# Patient Record
Sex: Female | Born: 1970 | Race: Black or African American | Hispanic: No | Marital: Married | Smoking: Never smoker
Health system: Southern US, Community
[De-identification: ages and names within clinical notes are randomized; demographics above are authoritative.]

## PROBLEM LIST (undated history)

## (undated) DIAGNOSIS — I10 Essential (primary) hypertension: Secondary | ICD-10-CM

## (undated) HISTORY — PX: TUMOR REMOVAL: SHX12

## (undated) HISTORY — PX: WISDOM TOOTH EXTRACTION: SHX21

## (undated) HISTORY — PX: CARPAL TUNNEL RELEASE: SHX101

---

## 1997-06-23 ENCOUNTER — Ambulatory Visit (HOSPITAL_COMMUNITY): Admission: RE | Admit: 1997-06-23 | Discharge: 1997-06-23 | Payer: Self-pay | Admitting: Obstetrics

## 1997-09-16 ENCOUNTER — Inpatient Hospital Stay (HOSPITAL_COMMUNITY): Admission: AD | Admit: 1997-09-16 | Discharge: 1997-09-16 | Payer: Self-pay | Admitting: Obstetrics

## 1997-11-05 ENCOUNTER — Ambulatory Visit (HOSPITAL_COMMUNITY): Admission: RE | Admit: 1997-11-05 | Discharge: 1997-11-05 | Payer: Self-pay | Admitting: Obstetrics

## 1998-01-31 ENCOUNTER — Inpatient Hospital Stay (HOSPITAL_COMMUNITY): Admission: AD | Admit: 1998-01-31 | Discharge: 1998-02-02 | Payer: Self-pay | Admitting: Obstetrics

## 1998-11-05 ENCOUNTER — Other Ambulatory Visit: Admission: RE | Admit: 1998-11-05 | Discharge: 1998-11-05 | Payer: Self-pay | Admitting: Obstetrics

## 1999-04-05 ENCOUNTER — Other Ambulatory Visit: Admission: RE | Admit: 1999-04-05 | Discharge: 1999-04-05 | Payer: Self-pay | Admitting: Obstetrics

## 1999-05-19 ENCOUNTER — Other Ambulatory Visit: Admission: RE | Admit: 1999-05-19 | Discharge: 1999-05-19 | Payer: Self-pay | Admitting: Obstetrics

## 2000-01-27 ENCOUNTER — Emergency Department (HOSPITAL_COMMUNITY): Admission: EM | Admit: 2000-01-27 | Discharge: 2000-01-28 | Payer: Self-pay | Admitting: Emergency Medicine

## 2000-01-27 ENCOUNTER — Encounter: Payer: Self-pay | Admitting: Emergency Medicine

## 2000-03-20 ENCOUNTER — Other Ambulatory Visit: Admission: RE | Admit: 2000-03-20 | Discharge: 2000-03-20 | Payer: Self-pay | Admitting: Family Medicine

## 2000-08-21 ENCOUNTER — Emergency Department (HOSPITAL_COMMUNITY): Admission: EM | Admit: 2000-08-21 | Discharge: 2000-08-21 | Payer: Self-pay | Admitting: Emergency Medicine

## 2000-09-16 ENCOUNTER — Emergency Department (HOSPITAL_COMMUNITY): Admission: EM | Admit: 2000-09-16 | Discharge: 2000-09-17 | Payer: Self-pay | Admitting: Emergency Medicine

## 2000-09-17 ENCOUNTER — Encounter: Payer: Self-pay | Admitting: Emergency Medicine

## 2000-12-20 ENCOUNTER — Encounter: Admission: RE | Admit: 2000-12-20 | Discharge: 2000-12-20 | Payer: Self-pay | Admitting: Family Medicine

## 2000-12-20 ENCOUNTER — Encounter: Payer: Self-pay | Admitting: Family Medicine

## 2001-02-24 ENCOUNTER — Inpatient Hospital Stay (HOSPITAL_COMMUNITY): Admission: AD | Admit: 2001-02-24 | Discharge: 2001-02-24 | Payer: Self-pay | Admitting: Obstetrics

## 2001-07-23 ENCOUNTER — Inpatient Hospital Stay (HOSPITAL_COMMUNITY): Admission: AD | Admit: 2001-07-23 | Discharge: 2001-07-23 | Payer: Self-pay | Admitting: Obstetrics

## 2002-10-08 ENCOUNTER — Emergency Department (HOSPITAL_COMMUNITY): Admission: EM | Admit: 2002-10-08 | Discharge: 2002-10-08 | Payer: Self-pay | Admitting: Emergency Medicine

## 2002-10-08 ENCOUNTER — Encounter: Payer: Self-pay | Admitting: Emergency Medicine

## 2003-02-09 ENCOUNTER — Inpatient Hospital Stay (HOSPITAL_COMMUNITY): Admission: AD | Admit: 2003-02-09 | Discharge: 2003-02-10 | Payer: Self-pay | Admitting: Obstetrics

## 2003-08-01 ENCOUNTER — Emergency Department (HOSPITAL_COMMUNITY): Admission: AD | Admit: 2003-08-01 | Discharge: 2003-08-01 | Payer: Self-pay | Admitting: Family Medicine

## 2003-10-29 ENCOUNTER — Emergency Department (HOSPITAL_COMMUNITY): Admission: EM | Admit: 2003-10-29 | Discharge: 2003-10-29 | Payer: Self-pay | Admitting: Family Medicine

## 2004-08-23 ENCOUNTER — Ambulatory Visit (HOSPITAL_COMMUNITY): Admission: RE | Admit: 2004-08-23 | Discharge: 2004-08-23 | Payer: Self-pay | Admitting: Specialist

## 2004-08-23 ENCOUNTER — Ambulatory Visit (HOSPITAL_BASED_OUTPATIENT_CLINIC_OR_DEPARTMENT_OTHER): Admission: RE | Admit: 2004-08-23 | Discharge: 2004-08-23 | Payer: Self-pay | Admitting: Specialist

## 2004-10-25 ENCOUNTER — Encounter: Admission: RE | Admit: 2004-10-25 | Discharge: 2004-10-25 | Payer: Self-pay | Admitting: Family Medicine

## 2005-07-01 ENCOUNTER — Ambulatory Visit (HOSPITAL_COMMUNITY): Admission: RE | Admit: 2005-07-01 | Discharge: 2005-07-01 | Payer: Self-pay | Admitting: Obstetrics

## 2005-09-08 ENCOUNTER — Encounter: Admission: RE | Admit: 2005-09-08 | Discharge: 2005-09-08 | Payer: Self-pay | Admitting: Internal Medicine

## 2005-10-26 ENCOUNTER — Emergency Department (HOSPITAL_COMMUNITY): Admission: EM | Admit: 2005-10-26 | Discharge: 2005-10-26 | Payer: Self-pay | Admitting: Family Medicine

## 2006-09-14 ENCOUNTER — Ambulatory Visit (HOSPITAL_BASED_OUTPATIENT_CLINIC_OR_DEPARTMENT_OTHER): Admission: RE | Admit: 2006-09-14 | Discharge: 2006-09-14 | Payer: Self-pay | Admitting: Orthopedic Surgery

## 2008-06-30 ENCOUNTER — Other Ambulatory Visit: Admission: RE | Admit: 2008-06-30 | Discharge: 2008-06-30 | Payer: Self-pay | Admitting: Obstetrics and Gynecology

## 2010-05-30 ENCOUNTER — Encounter: Payer: Self-pay | Admitting: Family Medicine

## 2014-03-27 ENCOUNTER — Emergency Department (HOSPITAL_BASED_OUTPATIENT_CLINIC_OR_DEPARTMENT_OTHER)
Admission: EM | Admit: 2014-03-27 | Discharge: 2014-03-27 | Disposition: A | Discharge status: Home / Self Care | Payer: Self-pay | Attending: Emergency Medicine | Admitting: Emergency Medicine

## 2014-03-27 ENCOUNTER — Encounter (HOSPITAL_BASED_OUTPATIENT_CLINIC_OR_DEPARTMENT_OTHER): Payer: Self-pay | Admitting: Emergency Medicine

## 2014-03-27 DIAGNOSIS — R5383 Other fatigue: Secondary | ICD-10-CM | POA: Insufficient documentation

## 2014-03-27 DIAGNOSIS — Z79899 Other long term (current) drug therapy: Secondary | ICD-10-CM | POA: Insufficient documentation

## 2014-03-27 DIAGNOSIS — M545 Low back pain: Secondary | ICD-10-CM | POA: Insufficient documentation

## 2014-03-27 DIAGNOSIS — I10 Essential (primary) hypertension: Secondary | ICD-10-CM | POA: Insufficient documentation

## 2014-03-27 DIAGNOSIS — M542 Cervicalgia: Secondary | ICD-10-CM | POA: Insufficient documentation

## 2014-03-27 DIAGNOSIS — M791 Myalgia: Secondary | ICD-10-CM | POA: Insufficient documentation

## 2014-03-27 DIAGNOSIS — J029 Acute pharyngitis, unspecified: Secondary | ICD-10-CM | POA: Insufficient documentation

## 2014-03-27 HISTORY — DX: Essential (primary) hypertension: I10

## 2014-03-27 LAB — RAPID STREP SCREEN (MED CTR MEBANE ONLY): STREPTOCOCCUS, GROUP A SCREEN (DIRECT): NEGATIVE

## 2014-03-27 MED ORDER — BENZONATATE 100 MG PO CAPS: 100.0000 mg | ORAL_CAPSULE | Freq: Three times a day (TID) | ORAL | Status: DC

## 2014-03-27 MED ORDER — GUAIFENESIN-CODEINE 100-10 MG/5ML PO SOLN: 5.0000 mL | Freq: Four times a day (QID) | ORAL | Status: DC | PRN

## 2014-03-27 MED ORDER — DEXAMETHASONE 4 MG PO TABS
6.0000 mg | ORAL_TABLET | Freq: Once | ORAL | Status: AC
  Administered 2014-03-27: 6 mg via ORAL

## 2014-03-27 MED ORDER — DEXAMETHASONE 4 MG PO TABS
ORAL_TABLET | ORAL | Status: AC
  Administered 2014-03-27: 6 mg via ORAL
  Filled 2014-03-27: qty 2

## 2014-03-27 NOTE — Discharge Instructions (Signed)

## 2014-03-27 NOTE — ED Provider Notes (Signed)
CSN: [id number]     Arrival date & time 03/27/14  1626 History   First MD Initiated Contact with Patient 03/27/14 1756     This chart was scribed for Toy CookeyMegan Alvenia Treese, MD by Arlan OrganAshley Leger, ED Scribe. This patient was seen in room MH09/MH09 and the patient's care was started 8:16 PM.   Chief Complaint  Patient presents with  . Cold, cough, neck and back pain     Patient is a 43 y.o. female presenting with cough. The history is provided by the patient. No language interpreter was used.  Cough Cough characteristics:  Dry Severity:  Moderate Onset quality:  Gradual Duration:  2 days Timing:  Constant Progression:  Unchanged Chronicity:  New Smoker: yes   Relieved by:  Nothing Worsened by:  Nothing tried Ineffective treatments:  None tried Associated symptoms: chills, myalgias, rhinorrhea and sore throat   Associated symptoms: no chest pain, no diaphoresis, no eye discharge, no fever, no headaches, no shortness of breath and no wheezing   Associated symptoms comment:  Sore throat Sore throat:    Duration:  2 days   Timing:  Constant   Progression:  Unchanged   HPI Comments: Kristen Wiggins is a 43 y.o. female who presents to the Emergency Department complaining of cough x 1 week that has progressively worsened. She also reports back pain, neck pain  She denies any   No known allergies to medications. Past Medical History  Diagnosis Date  . Hypertension    Past Surgical History  Procedure Laterality Date  . Tumor removal      right arm   History reviewed. No pertinent family history. History  Substance Use Topics  . Smoking status: Never Smoker   . Smokeless tobacco: Not on file  . Alcohol Use: No   OB History    No data available     Review of Systems  Constitutional: Positive for chills and fatigue. Negative for fever, diaphoresis, activity change and appetite change.  HENT: Positive for congestion, rhinorrhea and sore throat. Negative for facial swelling.   Eyes:  Negative for photophobia and discharge.  Respiratory: Positive for cough. Negative for chest tightness, shortness of breath and wheezing.   Cardiovascular: Negative for chest pain, palpitations and leg swelling.  Gastrointestinal: Negative for nausea, vomiting, abdominal pain and diarrhea.  Endocrine: Negative for polydipsia and polyuria.  Genitourinary: Negative for dysuria, frequency, difficulty urinating and pelvic pain.  Musculoskeletal: Positive for myalgias. Negative for back pain, arthralgias, neck pain and neck stiffness.  Skin: Negative for color change and wound.  Allergic/Immunologic: Negative for immunocompromised state.  Neurological: Negative for facial asymmetry, weakness, numbness and headaches.  Hematological: Does not bruise/bleed easily.  Psychiatric/Behavioral: Negative for confusion and agitation.      Allergies  Review of patient's allergies indicates no known allergies.  Home Medications   Prior to Admission medications   Medication Sig Start Date End Date Taking? Authorizing Provider  triamterene-hydrochlorothiazide (DYAZIDE) 37.5-25 MG per capsule Take 1 capsule by mouth daily.   Yes Historical Provider, MD  benzonatate (TESSALON) 100 MG capsule Take 1 capsule (100 mg total) by mouth every 8 (eight) hours. 03/27/14   Toy CookeyMegan Samira Acero, MD  guaiFENesin-codeine 100-10 MG/5ML syrup Take 5 mLs by mouth every 6 (six) hours as needed for cough. 03/27/14   Toy CookeyMegan Breeanne Oblinger, MD    Triage Vitals: BP 153/91 mmHg  Pulse 84  Temp(Src) 98.6 F (37 C) (Oral)  Resp 18  Ht 5\' 8"  (1.727 m)  Wt 230 lb (  104.327 kg)  BMI 34.98 kg/m2  SpO2 98%  LMP 03/27/2014    Physical Exam  Constitutional: She is oriented to person, place, and time. She appears well-developed and well-nourished. No distress.  HENT:  Head: Normocephalic and atraumatic.  Mouth/Throat: Posterior oropharyngeal erythema present. No oropharyngeal exudate, posterior oropharyngeal edema or tonsillar abscesses.   Eyes: Pupils are equal, round, and reactive to light.  Neck: Normal range of motion. Neck supple.  Cardiovascular: Normal rate, regular rhythm and normal heart sounds.  Exam reveals no gallop and no friction rub.   No murmur heard. Pulmonary/Chest: Effort normal and breath sounds normal. No respiratory distress. She has no wheezes. She has no rales.  Abdominal: Soft. Bowel sounds are normal. She exhibits no distension and no mass. There is no tenderness. There is no rebound and no guarding.  Musculoskeletal: Normal range of motion. She exhibits no edema or tenderness.  Neurological: She is alert and oriented to person, place, and time.  Skin: Skin is warm and dry.  Psychiatric: She has a normal mood and affect.    ED Course  Procedures (including critical care time)  DIAGNOSTIC STUDIES: Oxygen Saturation is 98% on RA, Normal by my interpretation.    COORDINATION OF CARE: 8:16 PM-Discussed treatment plan with pt at bedside and pt agreed to plan.     Labs Review Labs Reviewed  RAPID STREP SCREEN    Imaging Review No results found.   EKG Interpretation None      MDM   Final diagnoses:  Viral pharyngitis    Pt is a 43 y.o. female with Pmhx as above who presents with 2 days of cough, congestion, sore throat and chills.  She has an aching in her chest, back and neck when she coughs, but no shortness of breath and no chest pain at rest.  She has had anorexia.  No vomiting, diarrhea, urinary symptoms.  No known fevers.  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  On physical exam vital signs are stable and patient is in no acute distress.  Cardiopulmonary exam is benign.  She has mild posterior oropharyngeal erythema/ rapid strep negative.  I suspect an acute viral process.  We'll discharge home with Tessalon Perles and Fenesin codeine syrup.    I personally performed the services described in this documentation, which was scribed in my presence. The recorded  information has been reviewed and is accurate.    Toy CookeyMegan Keedan Sample, MD 03/27/14 2016

## 2014-03-27 NOTE — ED Notes (Signed)
Pt states that she had been sick since last Thursday with cough, back and neck pain.  States it's just getting worse.

## 2014-03-29 LAB — CULTURE, GROUP A STREP

## 2014-04-07 ENCOUNTER — Emergency Department (HOSPITAL_BASED_OUTPATIENT_CLINIC_OR_DEPARTMENT_OTHER): Payer: Self-pay

## 2014-04-07 ENCOUNTER — Emergency Department (HOSPITAL_BASED_OUTPATIENT_CLINIC_OR_DEPARTMENT_OTHER)
Admission: EM | Admit: 2014-04-07 | Discharge: 2014-04-07 | Disposition: A | Discharge status: Home / Self Care | Payer: Self-pay | Attending: Emergency Medicine | Admitting: Emergency Medicine

## 2014-04-07 ENCOUNTER — Encounter (HOSPITAL_BASED_OUTPATIENT_CLINIC_OR_DEPARTMENT_OTHER): Payer: Self-pay | Admitting: *Deleted

## 2014-04-07 DIAGNOSIS — R05 Cough: Secondary | ICD-10-CM

## 2014-04-07 DIAGNOSIS — H9209 Otalgia, unspecified ear: Secondary | ICD-10-CM | POA: Insufficient documentation

## 2014-04-07 DIAGNOSIS — R059 Cough, unspecified: Secondary | ICD-10-CM

## 2014-04-07 DIAGNOSIS — Z79899 Other long term (current) drug therapy: Secondary | ICD-10-CM | POA: Insufficient documentation

## 2014-04-07 DIAGNOSIS — I10 Essential (primary) hypertension: Secondary | ICD-10-CM | POA: Insufficient documentation

## 2014-04-07 DIAGNOSIS — J029 Acute pharyngitis, unspecified: Secondary | ICD-10-CM | POA: Insufficient documentation

## 2014-04-07 LAB — CBC WITH DIFFERENTIAL/PLATELET
Basophils Absolute: 0.1 K/uL (ref 0.0–0.1)
Basophils Relative: 1 % (ref 0–1)
Eosinophils Absolute: 0.2 K/uL (ref 0.0–0.7)
Eosinophils Relative: 2 % (ref 0–5)
HCT: 40.3 % (ref 36.0–46.0)
Hemoglobin: 13.3 g/dL (ref 12.0–15.0)
Lymphocytes Relative: 31 % (ref 12–46)
Lymphs Abs: 2.7 K/uL (ref 0.7–4.0)
MCH: 29.5 pg (ref 26.0–34.0)
MCHC: 33 g/dL (ref 30.0–36.0)
MCV: 89.4 fL (ref 78.0–100.0)
Monocytes Absolute: 0.8 K/uL (ref 0.1–1.0)
Monocytes Relative: 9 % (ref 3–12)
Neutro Abs: 5 K/uL (ref 1.7–7.7)
Neutrophils Relative %: 57 % (ref 43–77)
Platelets: 278 K/uL (ref 150–400)
RBC: 4.51 MIL/uL (ref 3.87–5.11)
RDW: 14.1 % (ref 11.5–15.5)
WBC: 8.7 K/uL (ref 4.0–10.5)

## 2014-04-07 LAB — BASIC METABOLIC PANEL
Anion gap: 11 (ref 5–15)
BUN: 8 mg/dL (ref 6–23)
CO2: 29 mEq/L (ref 19–32)
Calcium: 9.2 mg/dL (ref 8.4–10.5)
Chloride: 101 mEq/L (ref 96–112)
Creatinine, Ser: 1 mg/dL (ref 0.50–1.10)
GFR calc Af Amer: 78 mL/min — ABNORMAL LOW (ref >90)
GFR calc non Af Amer: 67 mL/min — ABNORMAL LOW (ref >90)
GLUCOSE: 98 mg/dL (ref 70–99)
POTASSIUM: 3.7 meq/L (ref 3.7–5.3)
Sodium: 141 mEq/L (ref 137–147)

## 2014-04-07 MED ORDER — CLINDAMYCIN HCL 300 MG PO CAPS: 300.0000 mg | ORAL_CAPSULE | Freq: Four times a day (QID) | ORAL | Status: DC

## 2014-04-07 NOTE — ED Notes (Signed)
Patient transported to CT 

## 2014-04-07 NOTE — Discharge Instructions (Signed)

## 2014-04-07 NOTE — ED Notes (Addendum)
Seen here 11/9 for sore throat- sx not improved since then- c/o cough also- denies known fever

## 2014-04-07 NOTE — ED Notes (Signed)
Pt alert, NAD, calm, interactive, resps e/u, speech clear, no dyspnea noted. [facility], plan, options.

## 2014-04-07 NOTE — ED Provider Notes (Signed)
CSN: [id number]     Arrival date & time 04/07/14  1706 History  This chart was scribed for Kristen Mo, MD by Gwenyth Ober, ED Scribe. This patient was seen in room MH11/MH11 and the patient's care was started at 6:37 PM.    Chief Complaint  Patient presents with  . Sore Throat   The history is provided by the patient. No language interpreter was used.    HPI Comments: Kristen Wiggins is a 43 y.o. female with a history of HTN who presents to the Emergency Department complaining of gradually worsening, constant sore throat that started 2 weeks ago. Pt states cough with yellow sputum and traces of blood and anterior bilateral ear pain as associated symptoms. Pt was seen in the ED on 11/19 for similar symptoms, was diagnosed with an acute viral process and prescribed Tessalon Perles and Fenesin codeine syrup. Pt does not smoke. Pt denies nausea, vomiting and diarrhea as associated symptoms.  No fevers recently.    Past Medical History  Diagnosis Date  . Hypertension    Past Surgical History  Procedure Laterality Date  . Tumor removal      right arm   No family history on file. History  Substance Use Topics  . Smoking status: Never Smoker   . Smokeless tobacco: Not on file  . Alcohol Use: No   OB History    No data available     Review of Systems  HENT: Positive for ear pain and sore throat.   Respiratory: Positive for cough.   Gastrointestinal: Negative for nausea, vomiting and diarrhea.  All other systems reviewed and are negative.   Allergies  Review of patient's allergies indicates no known allergies.  Home Medications   Prior to Admission medications   Medication Sig Start Date End Date Taking? Authorizing Provider  benzonatate (TESSALON) 100 MG capsule Take 1 capsule (100 mg total) by mouth every 8 (eight) hours. 03/27/14  Yes Toy Cookey, MD  guaiFENesin-codeine 100-10 MG/5ML syrup Take 5 mLs by mouth every 6 (six) hours as needed for cough. 03/27/14  Yes  Toy Cookey, MD  triamterene-hydrochlorothiazide (DYAZIDE) 37.5-25 MG per capsule Take 1 capsule by mouth daily.   Yes Historical Provider, MD  clindamycin (CLEOCIN) 300 MG capsule Take 1 capsule (300 mg total) by mouth 4 (four) times daily. X 7 days 04/07/14   Kristen Mo, MD   BP 138/82 mmHg  Pulse 76  Temp(Src) 98.8 F (37.1 C) (Oral)  Resp 18  Ht 5\' 8"  (1.727 m)  Wt 230 lb (104.327 kg)  BMI 34.98 kg/m2  SpO2 98%  LMP 03/17/2014 Physical Exam  Constitutional: She is oriented to person, place, and time. She appears well-developed and well-nourished.  HENT:  Head: Normocephalic and atraumatic.  Right Ear: External ear normal.  Left Ear: External ear normal.  L tonsilar swelling> R.  Tender bil LAD  Eyes: Conjunctivae and EOM are normal. Pupils are equal, round, and reactive to light.  Neck: Normal range of motion. Neck supple.  Cardiovascular: Normal rate, regular rhythm, normal heart sounds and intact distal pulses.   Pulmonary/Chest: Effort normal and breath sounds normal.  Abdominal: Soft. Bowel sounds are normal. There is no tenderness.  Musculoskeletal: Normal range of motion.  Neurological: She is alert and oriented to person, place, and time.  Skin: Skin is warm and dry.  Vitals reviewed.   ED Course  Procedures (including critical care time) DIAGNOSTIC STUDIES: Oxygen Saturation is 98% on RA, normal by my interpretation.  COORDINATION OF CARE: 6:39 PM Discussed treatment plan with pt which includes CT Maxillofacial, lab work and chest x-ray. Pt agreed to plan.  Labs Review Labs Reviewed  BASIC METABOLIC PANEL - Abnormal; Notable for the following:    GFR calc non Af Amer 67 (*)    GFR calc Af Amer 78 (*)    All other components within normal limits  CBC WITH DIFFERENTIAL    Imaging Review Dg Chest 2 View  04/07/2014   CLINICAL DATA:  Productive cough and sore throat.  EXAM: CHEST - 2 VIEW  COMPARISON:  09/26/2012, [facility]  FINDINGS: The  heart size and mediastinal contours are within normal limits. There is stable eventration of the anterior right hemidiaphragm. There is no evidence of pulmonary edema, consolidation, pneumothorax, nodule or pleural fluid. The visualized skeletal structures are unremarkable.  IMPRESSION: No active disease.   Electronically Signed   By: Irish LackGlenn  Yamagata M.D.   On: 04/07/2014 19:37   Ct Maxillofacial Wo Cm  04/07/2014   CLINICAL DATA:  Bilateral facial pain, left greater than right. No known trauma.  EXAM: CT MAXILLOFACIAL WITHOUT CONTRAST  TECHNIQUE: Multidetector CT imaging of the maxillofacial structures was performed. Multiplanar CT image reconstructions were also generated. A small metallic BB was placed on the right temple in order to reliably differentiate right from left.  COMPARISON:  None.  FINDINGS: No displaced facial fracture. Regional soft tissues are normal. No radiopaque foreign body.  Normal noncontrast appearance of the bilateral orbits and globes.  Normal appearance of the bilateral pterygoid plates. Normal appearance of the mandible. The bilateral mandibular condyles are normally located. Normal appearance of the bilateral zygomatic arches.  The paranasal sinuses and mastoid air cells are normally aerated. No significant nasal septal deviation. No air-fluid levels. Limited visualization of the intracranial structures is normal.  Limited visualization the superior aspect of the cervical spine is normal.  IMPRESSION: No explanation for patient's bilateral facial pain. Specifically, no acute or aggressive osseus abnormalities. No air-fluid levels to suggest acute sinusitis.   Electronically Signed   By: Simonne ComeJohn  Watts M.D.   On: 04/07/2014 19:31     EKG Interpretation None      MDM   Final diagnoses:  Cough  Sore throat    43 y.o. female with pertinent PMH as above presents with continued sore throat x 2 weeks.  No recent fevers.  She has had a nonproductive cough for the same time.  She  presents today in company of her husband who is being seen for unrelated complaints. The patient was seen 2 weeks ago for similar symptoms had a strep screen was negative, was sent home with symptomatically. She states that her symptoms of worsened since this time. Today vitals and physical exam as above. Patient has poor Mallampati score, however within context of limited exam but appears that her left tonsil is more swollen than her right. There is no exudate.  She appears well, has no problems with phonation.  Intended to obtain CT scan of face and neck to rule out RPA or PTA, however unfortunately this was unintentionally obtained without contrast. Within context of limited exam no appreciable abscess noted. I spoke with the radiologist and explained clinical scenario and he did not see anything suspicious. I discussed that the patient received a scan which is less than ideal and gave her the option of repeating a CT scan with contrast versus taking antibiotic since following up with ENT. The patient elected to get antibiotics  and follow-up. I gave her strict return precautions for any worsening symptoms, she voiced understanding and agreed to follow-up.  Clindamycin prescribed.    1. Cough   2. Sore throat       Kristen MoMatthew Guiselle Mian, MD 04/07/14 2008

## 2015-05-03 IMAGING — CT CT MAXILLOFACIAL W/O CM
1 series · 15 of 30 positions shown, 19 images · non-contrast
Comparison: None.

CLINICAL DATA: Bilateral facial pain, left greater than right. No
known trauma.

EXAM:
CT MAXILLOFACIAL WITHOUT CONTRAST
TECHNIQUE: Multidetector CT imaging of the maxillofacial structures was
performed. Multiplanar CT image reconstructions were also generated.
A small metallic BB was placed on the right temple in order to
reliably differentiate right from left.

[Series 2: maxillofacial 2.0 h30s st · axial · 0.33mm/px · z∈[-280,-144]mm · 15 of 74 slices shown, 19 images]
[im 3/74  brain]
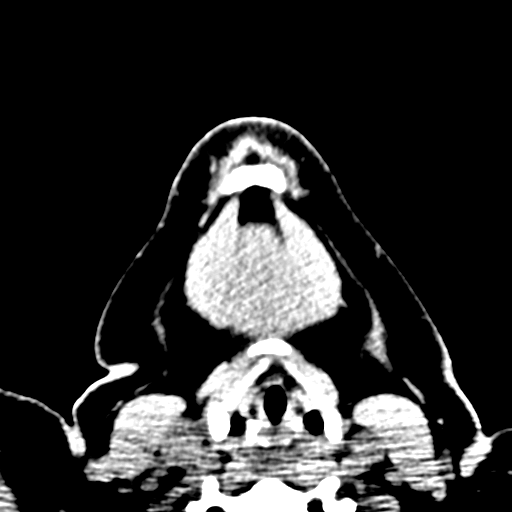
[im 3/74  bone]
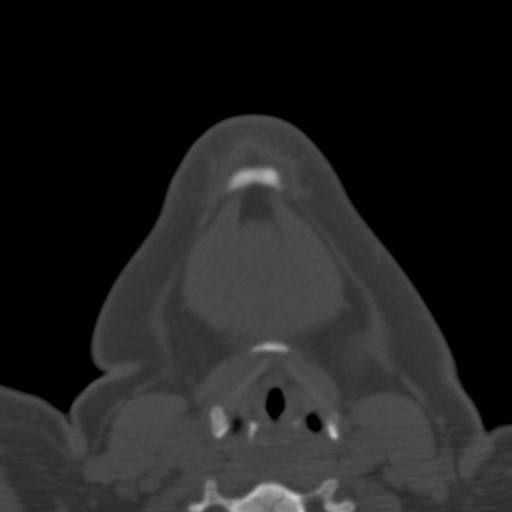
[im 8/74  bone]
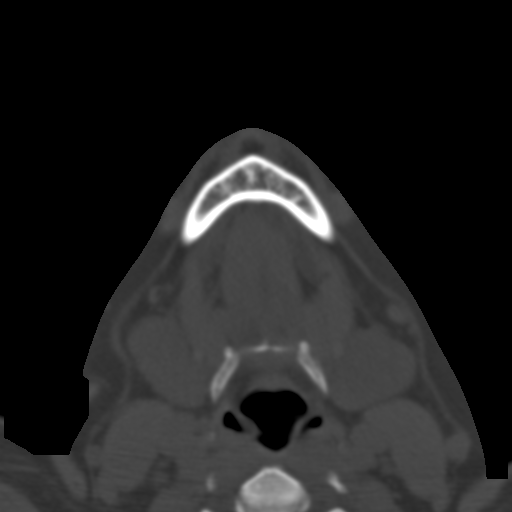
[im 13/74  bone]
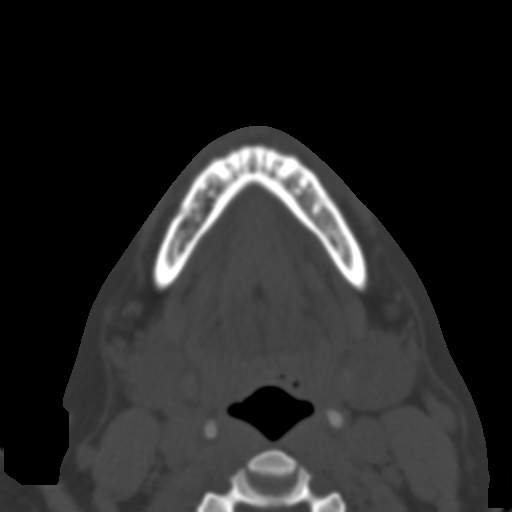
[im 18/74  bone]
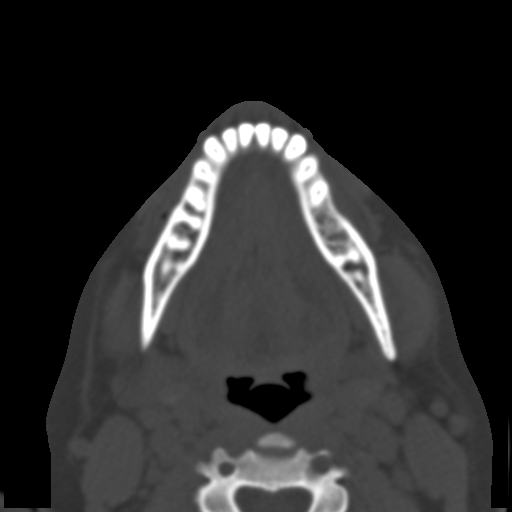
[im 23/74  brain]
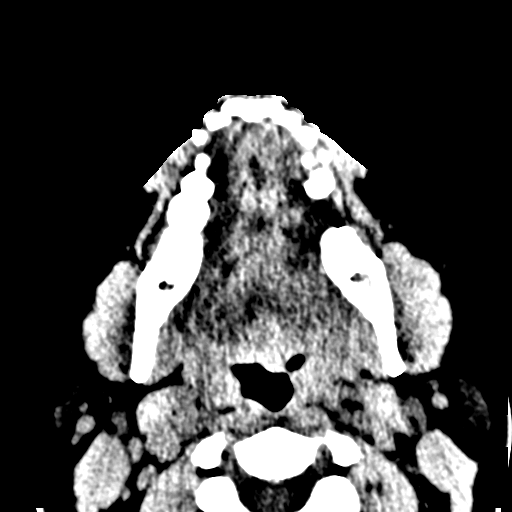
[im 23/74  bone]
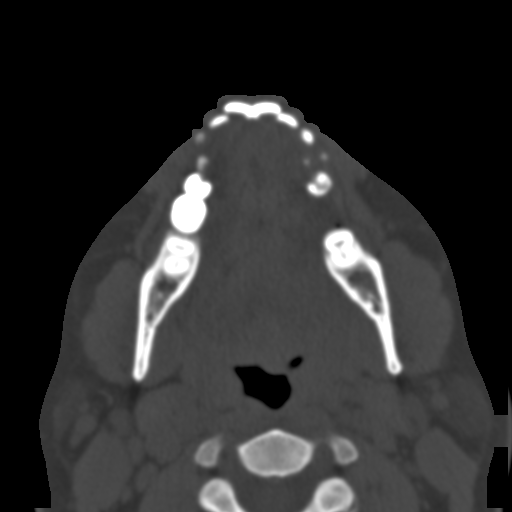
[im 28/74  bone]
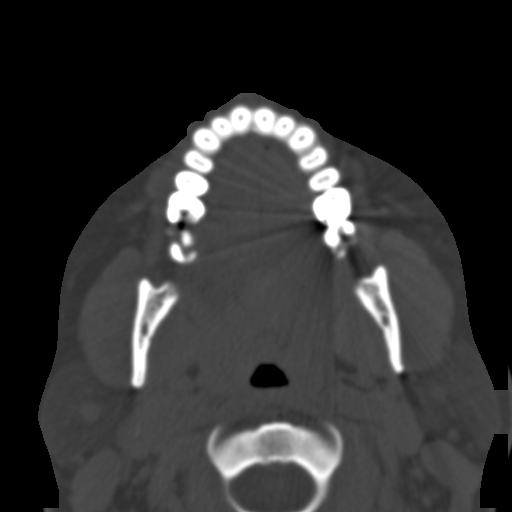
[im 33/74  bone]
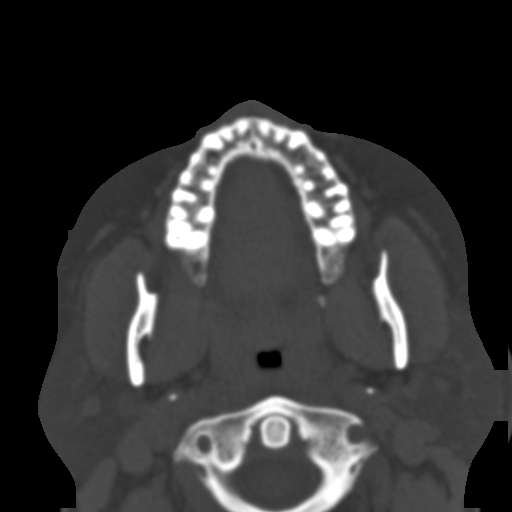
[im 38/74  bone]
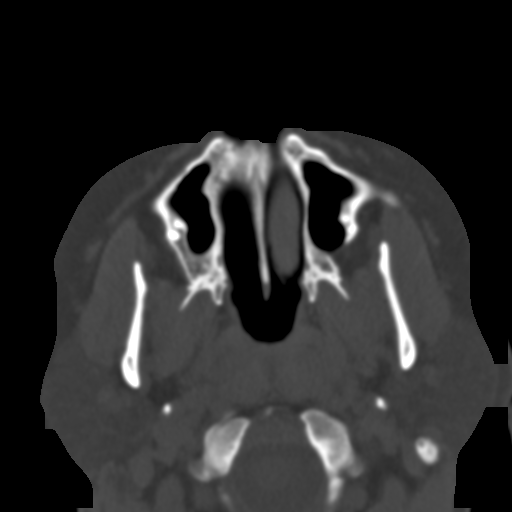
[im 41/74  brain]
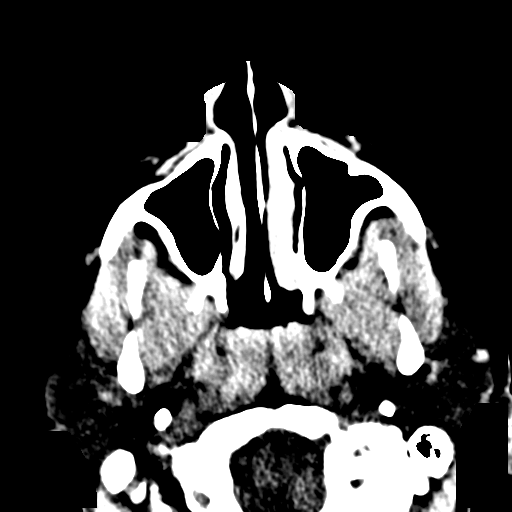
[im 41/74  bone]
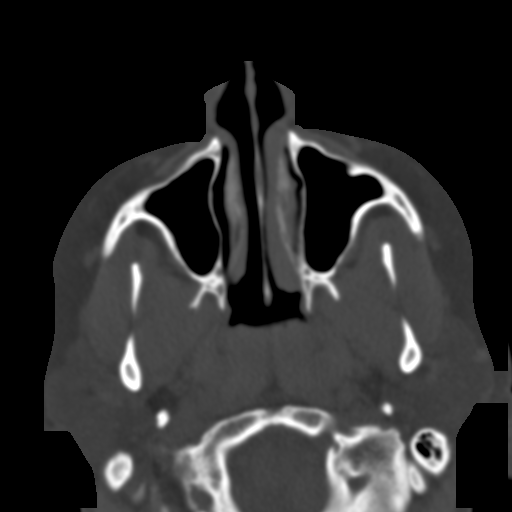
[im 46/74  bone]
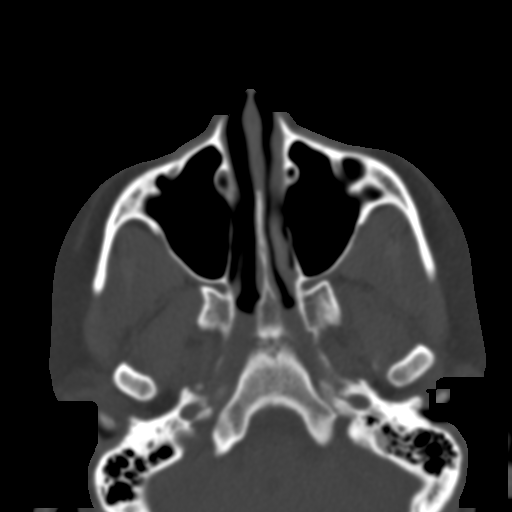
[im 51/74  bone]
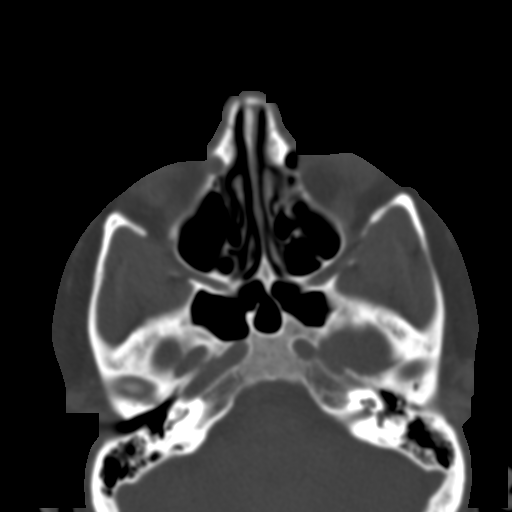
[im 56/74  bone]
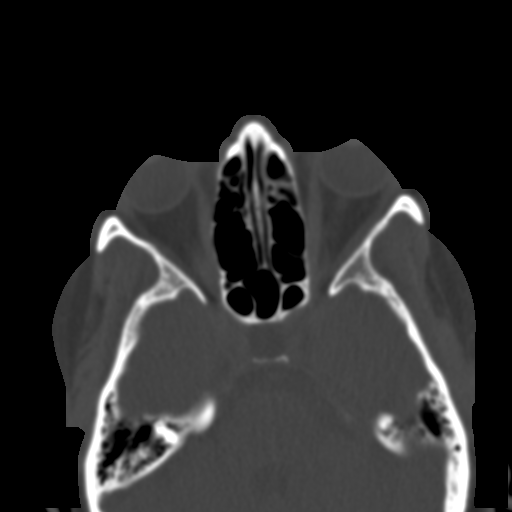
[im 61/74  brain]
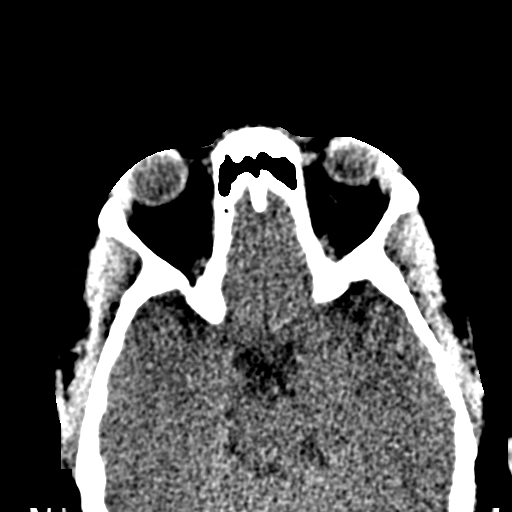
[im 61/74  bone]
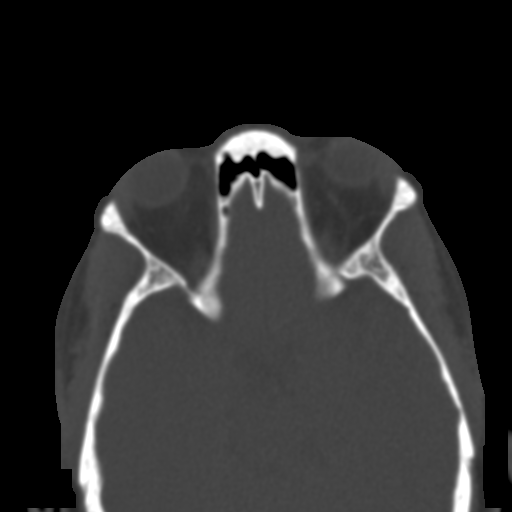
[im 66/74  bone]
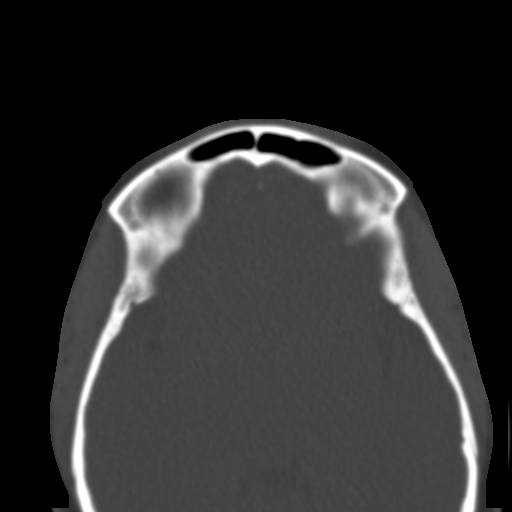
[im 71/74  bone]
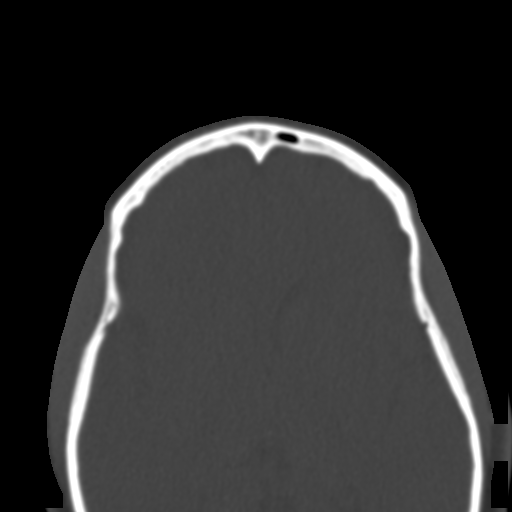

[15 of 30 positions shown; findings below may reference images not displayed]

FINDINGS: No displaced facial fracture. Regional soft tissues are normal. No
radiopaque foreign body.

Normal noncontrast appearance of the bilateral orbits and globes.

Normal appearance of the bilateral pterygoid plates. Normal
appearance of the mandible. The bilateral mandibular condyles are
normally located. Normal appearance of the bilateral zygomatic
arches.

The paranasal sinuses and mastoid air cells are normally aerated. No
significant nasal septal deviation. No air-fluid levels. Limited
visualization of the intracranial structures is normal.

Limited visualization the superior aspect of the cervical spine is
normal.
IMPRESSION: No explanation for patient's bilateral facial pain. Specifically, no
acute or aggressive osseus abnormalities. No air-fluid levels to
suggest acute sinusitis.

## 2016-05-30 ENCOUNTER — Emergency Department (HOSPITAL_BASED_OUTPATIENT_CLINIC_OR_DEPARTMENT_OTHER): Payer: Self-pay

## 2016-05-30 ENCOUNTER — Encounter (HOSPITAL_BASED_OUTPATIENT_CLINIC_OR_DEPARTMENT_OTHER): Payer: Self-pay

## 2016-05-30 ENCOUNTER — Emergency Department (HOSPITAL_BASED_OUTPATIENT_CLINIC_OR_DEPARTMENT_OTHER)
Admission: EM | Admit: 2016-05-30 | Discharge: 2016-05-30 | Disposition: A | Discharge status: Home / Self Care | Payer: Self-pay | Attending: Emergency Medicine | Admitting: Emergency Medicine

## 2016-05-30 DIAGNOSIS — Z79899 Other long term (current) drug therapy: Secondary | ICD-10-CM | POA: Insufficient documentation

## 2016-05-30 DIAGNOSIS — R197 Diarrhea, unspecified: Secondary | ICD-10-CM

## 2016-05-30 DIAGNOSIS — E860 Dehydration: Secondary | ICD-10-CM | POA: Insufficient documentation

## 2016-05-30 DIAGNOSIS — I10 Essential (primary) hypertension: Secondary | ICD-10-CM | POA: Insufficient documentation

## 2016-05-30 LAB — CBC WITH DIFFERENTIAL/PLATELET
Basophils Absolute: 0.1 10*3/uL (ref 0.0–0.1)
Basophils Relative: 1 %
EOS PCT: 2 %
Eosinophils Absolute: 0.1 10*3/uL (ref 0.0–0.7)
HCT: 40.4 % (ref 36.0–46.0)
Hemoglobin: 13.7 g/dL (ref 12.0–15.0)
LYMPHS ABS: 2.1 10*3/uL (ref 0.7–4.0)
Lymphocytes Relative: 34 %
MCH: 30.4 pg (ref 26.0–34.0)
MCHC: 33.9 g/dL (ref 30.0–36.0)
MCV: 89.6 fL (ref 78.0–100.0)
MONOS PCT: 11 %
Monocytes Absolute: 0.7 10*3/uL (ref 0.1–1.0)
NEUTROS ABS: 3.1 10*3/uL (ref 1.7–7.7)
Neutrophils Relative %: 52 %
PLATELETS: 262 10*3/uL (ref 150–400)
RBC: 4.51 MIL/uL (ref 3.87–5.11)
RDW: 13.4 % (ref 11.5–15.5)
WBC: 6.1 10*3/uL (ref 4.0–10.5)

## 2016-05-30 LAB — COMPREHENSIVE METABOLIC PANEL
ALT: 15 U/L (ref 14–54)
ANION GAP: 5 (ref 5–15)
AST: 14 U/L — ABNORMAL LOW (ref 15–41)
Albumin: 3.9 g/dL (ref 3.5–5.0)
Alkaline Phosphatase: 49 U/L (ref 38–126)
BUN: 9 mg/dL (ref 6–20)
CHLORIDE: 111 mmol/L (ref 101–111)
CO2: 24 mmol/L (ref 22–32)
CREATININE: 0.92 mg/dL (ref 0.44–1.00)
Calcium: 8.8 mg/dL — ABNORMAL LOW (ref 8.9–10.3)
Glucose, Bld: 98 mg/dL (ref 65–99)
POTASSIUM: 3.6 mmol/L (ref 3.5–5.1)
Sodium: 140 mmol/L (ref 135–145)
Total Bilirubin: 0.5 mg/dL (ref 0.3–1.2)
Total Protein: 7 g/dL (ref 6.5–8.1)

## 2016-05-30 LAB — URINALYSIS, ROUTINE W REFLEX MICROSCOPIC
BILIRUBIN URINE: NEGATIVE
GLUCOSE, UA: NEGATIVE mg/dL
HGB URINE DIPSTICK: NEGATIVE
Ketones, ur: NEGATIVE mg/dL
Leukocytes, UA: NEGATIVE
Nitrite: NEGATIVE
PROTEIN: NEGATIVE mg/dL
SPECIFIC GRAVITY, URINE: 1.009 (ref 1.005–1.030)
pH: 7 (ref 5.0–8.0)

## 2016-05-30 LAB — PREGNANCY, URINE: PREG TEST UR: NEGATIVE

## 2016-05-30 MED ORDER — SODIUM CHLORIDE 0.9 % IV BOLUS (SEPSIS)
1000.0000 mL | Freq: Once | INTRAVENOUS | Status: AC
  Administered 2016-05-30: 1000 mL via INTRAVENOUS

## 2016-05-30 MED ORDER — IOPAMIDOL (ISOVUE-300) INJECTION 61%
100.0000 mL | Freq: Once | INTRAVENOUS | Status: AC | PRN
  Administered 2016-05-30: 100 mL via INTRAVENOUS

## 2016-05-30 MED ORDER — ONDANSETRON 4 MG PO TBDP
4.0000 mg | ORAL_TABLET | Freq: Three times a day (TID) | ORAL | 0 refills | Status: AC | PRN
Start: 2016-05-30 — End: ?

## 2016-05-30 NOTE — Discharge Instructions (Signed)
Also you can try imodium for diarrhea.

## 2016-05-30 NOTE — ED Triage Notes (Addendum)
C/o n/d x 5 days-elevated BP x 2-3 days-nose bleed yesterday-none today-states is compliant with BP meds-NAD-steady gait

## 2016-05-30 NOTE — ED Notes (Signed)
ED Provider at bedside. 

## 2016-05-30 NOTE — ED Provider Notes (Signed)
[facility]   CSN: [id number] Arrival date & time: 05/30/16  1347  By signing my name below, I, Vista Mink, attest that this documentation has been prepared under the direction and in the presence of Gwyneth Sprout, MD. Electronically signed, Vista Mink, ED Scribe. 05/30/16. 4:16 PM.  History   Chief Complaint Chief Complaint  Patient presents with  . Diarrhea    HPI HPI Comments: Kristen Wiggins is a 46 y.o. female, with Hx of HTN, who presents to the Emergency Department complaining of persistent nausea, diarrhea with that started 3 days ago. She also notes an episode of sharp right sided abdominal pain yesterday that lasted 30 seconds, has not recurred since. Pt's symptoms are exacerbated after eating. She had one episode of diarrhea yesterday after eating and did not eat following this episode because she did not want to have another episode of diarrhea. Pt has not had any episodes of diarrhea today but states that it is because she has not ate anything today. This does not feel similar to her IBS symptoms, she usually only has pain with her IBS but no diarrhea. She also notes a mild cough over the past week that has resolved. Pt's grandchildren recently had similar symptoms of diarrhea but they had vomiting as well. No hematochezia. No fever. She denies any urinary symptoms. No recent abx. No recent long distance travel.   The history is provided by the patient. No language interpreter was used.    Past Medical History:  Diagnosis Date  . Hypertension     There are no active problems to display for this patient.   Past Surgical History:  Procedure Laterality Date  . CARPAL TUNNEL RELEASE    . TUMOR REMOVAL     right arm  . WISDOM TOOTH EXTRACTION      OB History    No data available       Home Medications    Prior to Admission medications   Medication Sig Start Date End Date Taking? Authorizing Provider  bisoprolol-hydrochlorothiazide  (ZIAC) 5-6.25 MG tablet Take 1 tablet by mouth daily.   Yes Historical Provider, MD    Family History No family history on file.  Social History Social History  Substance Use Topics  . Smoking status: Never Smoker  . Smokeless tobacco: Never Used  . Alcohol use No    Allergies   Patient has no known allergies.   Review of Systems Review of Systems  Constitutional: Negative for fever.  Gastrointestinal: Positive for abdominal pain (resolved) and diarrhea. Negative for blood in stool.  Genitourinary: Negative for dysuria and hematuria.  All other systems reviewed and are negative.   Physical Exam Updated Vital Signs BP 173/76 (BP Location: Right Arm)   Pulse 64   Temp 98.9 F (37.2 C) (Oral)   Resp 18   Ht 5\' 8"  (1.727 m)   Wt 225 lb (102.1 kg)   LMP 05/10/2016   SpO2 96%   BMI 34.21 kg/m   Physical Exam  Constitutional: She appears well-developed and well-nourished. No distress.  HENT:  Head: Normocephalic and atraumatic.  Right Ear: External ear normal.  Left Ear: External ear normal.  Eyes: Conjunctivae are normal. Right eye exhibits no discharge. Left eye exhibits no discharge. No scleral icterus.  Neck: Neck supple. No tracheal deviation present.  Cardiovascular: Normal rate, regular rhythm and intact distal pulses.   Pulmonary/Chest: Effort normal and breath sounds normal. No stridor. No respiratory distress. She has no wheezes.  She has no rales.  Abdominal: Soft. She exhibits no distension. There is tenderness. There is guarding. There is no rebound.  Decreased bowel sounds. Right lower quadrant pain, mild guarding, no rebound.  Musculoskeletal: She exhibits no edema or tenderness.  Neurological: She is alert. She has normal strength. No cranial nerve deficit (no facial droop, extraocular movements intact, no slurred speech) or sensory deficit. She exhibits normal muscle tone. She displays no seizure activity. Coordination normal.  Skin: Skin is warm and  dry. No rash noted.  Psychiatric: She has a normal mood and affect.  Nursing note and vitals reviewed.   ED Treatments / Results  DIAGNOSTIC STUDIES: Oxygen Saturation is 96% on RA, normal by my interpretation.  COORDINATION OF CARE: 3:37 PM-Discussed treatment plan with pt at bedside and pt agreed to plan.   Labs (all labs ordered are listed, but only abnormal results are displayed) Labs Reviewed  COMPREHENSIVE METABOLIC PANEL - Abnormal; Notable for the following:       Result Value   Calcium 8.8 (*)    AST 14 (*)    All other components within normal limits  PREGNANCY, URINE  URINALYSIS, ROUTINE W REFLEX MICROSCOPIC  CBC WITH DIFFERENTIAL/PLATELET    EKG  EKG Interpretation None       Radiology Ct Abdomen Pelvis W Contrast  Result Date: 05/30/2016 CLINICAL DATA:  Right lower quadrant abdominal pain, nausea and diarrhea for the past 5 days. EXAM: CT ABDOMEN AND PELVIS WITH CONTRAST TECHNIQUE: Multidetector CT imaging of the abdomen and pelvis was performed using the standard protocol following bolus administration of intravenous contrast. CONTRAST:  100mL ISOVUE-300 IOPAMIDOL (ISOVUE-300) INJECTION 61% COMPARISON:  09/08/2005. FINDINGS: Lower chest: Clear lung bases. Hepatobiliary: The previously seen low density of the liver has resolved. Normal appearing gallbladder. Pancreas: Unremarkable. No pancreatic ductal dilatation or surrounding inflammatory changes. Spleen: Normal in size without focal abnormality. Adrenals/Urinary Tract: Interval minimal dilatation of the right renal collecting system without ureteral dilatation. The distal right ureter is not well visualized with no visible ureteral calculi. There are bilateral pelvic phleboliths. Normal appearing urinary bladder, left ureter and left kidney. Normal appearing adrenal glands. Stomach/Bowel: Stomach is within normal limits. Appendix appears normal. No evidence of bowel wall thickening, distention, or inflammatory  changes. Vascular/Lymphatic: No significant vascular findings are present. No enlarged abdominal or pelvic lymph nodes. Reproductive: Unremarkable uterus and ovaries with a right ovarian corpus luteum noted. Other: None. Musculoskeletal: Minimal lumbar and lower thoracic spine degenerative changes. IMPRESSION: 1. Interval minimal right hydronephrosis when no visible obstructing calculus. This may be due to an interval mild partial UPJ obstruction since there is no dilatation of the ureter. 2. Normal appearing appendix. 3. Resolved hepatic steatosis. Electronically Signed   By: Beckie SaltsSteven  Reid M.D.   On: 05/30/2016 17:04    Procedures Procedures (including critical care time)  Medications Ordered in ED Medications - No data to display   Initial Impression / Assessment and Plan / ED Course  I have reviewed the triage vital signs and the nursing notes.  Pertinent labs & imaging results that were available during my care of the patient were reviewed by me and considered in my medical decision making (see chart for details).     Patient is a 46 y/o female presenting today with persistent diarrhea, nausea and decreased by mouth intake. On exam she also has right lower quadrant pain with mild guarding but no rebound. She denies any urinary symptoms. She has no history making her high risk for  C. Difficile.  She denies any vaginal symptoms. Patient's urine and UPT are within normal limits. Labs CBC, CMP within normal limits. CT scan to rule out appendicitis was negative but did show interval minimal right hydronephrosis with no visible extracting calculus. This was discussed with patient and she will follow-up with her doctor if symptoms persist. After IV fluids and Zofran she is feeling much better. Also recommended trying Imodium for diarrhea.   Final Clinical Impressions(s) / ED Diagnoses   Final diagnoses:  Diarrhea, unspecified type  Dehydration    New Prescriptions New Prescriptions    ONDANSETRON (ZOFRAN ODT) 4 MG DISINTEGRATING TABLET    Take 1 tablet (4 mg total) by mouth every 8 (eight) hours as needed for nausea or vomiting.   I personally performed the services described in this documentation, which was scribed in my presence.  The recorded information has been reviewed and considered.     Gwyneth Sprout, MD 05/30/16 [phone]

## 2018-03-04 ENCOUNTER — Emergency Department (HOSPITAL_BASED_OUTPATIENT_CLINIC_OR_DEPARTMENT_OTHER)
Admission: EM | Admit: 2018-03-04 | Discharge: 2018-03-05 | Disposition: A | Discharge status: Home / Self Care | Payer: BLUE CROSS/BLUE SHIELD | Attending: Emergency Medicine | Admitting: Emergency Medicine

## 2018-03-04 ENCOUNTER — Emergency Department (HOSPITAL_BASED_OUTPATIENT_CLINIC_OR_DEPARTMENT_OTHER): Payer: BLUE CROSS/BLUE SHIELD

## 2018-03-04 ENCOUNTER — Encounter (HOSPITAL_BASED_OUTPATIENT_CLINIC_OR_DEPARTMENT_OTHER): Payer: Self-pay | Admitting: *Deleted

## 2018-03-04 ENCOUNTER — Other Ambulatory Visit: Payer: Self-pay

## 2018-03-04 DIAGNOSIS — R1031 Right lower quadrant pain: Secondary | ICD-10-CM | POA: Diagnosis present

## 2018-03-04 DIAGNOSIS — Z79899 Other long term (current) drug therapy: Secondary | ICD-10-CM | POA: Insufficient documentation

## 2018-03-04 DIAGNOSIS — I10 Essential (primary) hypertension: Secondary | ICD-10-CM | POA: Insufficient documentation

## 2018-03-04 DIAGNOSIS — R102 Pelvic and perineal pain: Secondary | ICD-10-CM

## 2018-03-04 DIAGNOSIS — K5900 Constipation, unspecified: Secondary | ICD-10-CM | POA: Diagnosis not present

## 2018-03-04 LAB — URINALYSIS, ROUTINE W REFLEX MICROSCOPIC
Bilirubin Urine: NEGATIVE
GLUCOSE, UA: NEGATIVE mg/dL
HGB URINE DIPSTICK: NEGATIVE
KETONES UR: NEGATIVE mg/dL
Leukocytes, UA: NEGATIVE
Nitrite: NEGATIVE
PROTEIN: NEGATIVE mg/dL
Specific Gravity, Urine: 1.01 (ref 1.005–1.030)
pH: 6.5 (ref 5.0–8.0)

## 2018-03-04 LAB — BASIC METABOLIC PANEL
Anion gap: 7 (ref 5–15)
BUN: 12 mg/dL (ref 6–20)
CO2: 24 mmol/L (ref 22–32)
CREATININE: 1 mg/dL (ref 0.44–1.00)
Calcium: 8.5 mg/dL — ABNORMAL LOW (ref 8.9–10.3)
Chloride: 109 mmol/L (ref 98–111)
GFR calc Af Amer: >60 mL/min (ref >60)
GFR calc non Af Amer: >60 mL/min (ref >60)
GLUCOSE: 106 mg/dL — AB (ref 70–99)
Potassium: 3.4 mmol/L — ABNORMAL LOW (ref 3.5–5.1)
Sodium: 140 mmol/L (ref 135–145)

## 2018-03-04 LAB — CBC WITH DIFFERENTIAL/PLATELET
Abs Immature Granulocytes: 0.01 10*3/uL (ref 0.00–0.07)
Basophils Absolute: 0.1 10*3/uL (ref 0.0–0.1)
Basophils Relative: 1 %
EOS ABS: 0.2 10*3/uL (ref 0.0–0.5)
EOS PCT: 3 %
HEMATOCRIT: 38.7 % (ref 36.0–46.0)
HEMOGLOBIN: 12.4 g/dL (ref 12.0–15.0)
Immature Granulocytes: 0 %
LYMPHS ABS: 2.5 10*3/uL (ref 0.7–4.0)
LYMPHS PCT: 37 %
MCH: 29.5 pg (ref 26.0–34.0)
MCHC: 32 g/dL (ref 30.0–36.0)
MCV: 92.1 fL (ref 80.0–100.0)
Monocytes Absolute: 0.6 10*3/uL (ref 0.1–1.0)
Monocytes Relative: 9 %
NRBC: 0 % (ref 0.0–0.2)
Neutro Abs: 3.4 10*3/uL (ref 1.7–7.7)
Neutrophils Relative %: 50 %
Platelets: 253 10*3/uL (ref 150–400)
RBC: 4.2 MIL/uL (ref 3.87–5.11)
RDW: 13.6 % (ref 11.5–15.5)
WBC: 6.8 10*3/uL (ref 4.0–10.5)

## 2018-03-04 LAB — PREGNANCY, URINE: Preg Test, Ur: NEGATIVE

## 2018-03-04 MED ORDER — ONDANSETRON HCL 4 MG/2ML IJ SOLN
4.0000 mg | Freq: Once | INTRAMUSCULAR | Status: AC
  Administered 2018-03-04: 4 mg via INTRAVENOUS
  Filled 2018-03-04: qty 2

## 2018-03-04 MED ORDER — IOPAMIDOL (ISOVUE-300) INJECTION 61%
100.0000 mL | Freq: Once | INTRAVENOUS | Status: AC | PRN
  Administered 2018-03-05: 100 mL via INTRAVENOUS

## 2018-03-04 MED ORDER — MORPHINE SULFATE (PF) 4 MG/ML IV SOLN
4.0000 mg | Freq: Once | INTRAVENOUS | Status: AC
  Administered 2018-03-04: 4 mg via INTRAVENOUS
  Filled 2018-03-04: qty 1

## 2018-03-04 NOTE — ED Triage Notes (Signed)
Pt reports RLQ pain since yesterday. Seen at [facility] clinic today and sent here to r/o appy

## 2018-03-05 ENCOUNTER — Inpatient Hospital Stay (HOSPITAL_BASED_OUTPATIENT_CLINIC_OR_DEPARTMENT_OTHER): Admit: 2018-03-05 | Payer: Self-pay

## 2018-03-05 ENCOUNTER — Other Ambulatory Visit (HOSPITAL_BASED_OUTPATIENT_CLINIC_OR_DEPARTMENT_OTHER): Payer: Self-pay | Admitting: Emergency Medicine

## 2018-03-05 MED ORDER — POLYETHYLENE GLYCOL 3350 17 G PO PACK: 17.0000 g | PACK | Freq: Every day | ORAL | 0 refills | Status: AC

## 2018-03-05 MED ORDER — HYDROMORPHONE HCL 1 MG/ML IJ SOLN
0.5000 mg | Freq: Once | INTRAMUSCULAR | Status: AC
  Administered 2018-03-05: 0.5 mg via INTRAVENOUS
  Filled 2018-03-05: qty 1

## 2018-03-05 NOTE — Discharge Instructions (Addendum)
You were seen today for abdominal pain.  Your CT scan is reassuring.  Take MiraLAX 1-2 times daily until stools are soft.  If you have new or worsening symptoms, you may need to schedule an outpatient ultrasound to evaluate your ovaries.

## 2018-03-05 NOTE — ED Notes (Signed)
Pt and family understood dc material. NAD noted. Script given at [facility]. Ultrasound information given to patient and spouse

## 2018-03-05 NOTE — ED Notes (Signed)
Patient transported to CT 

## 2018-03-05 NOTE — ED Notes (Signed)
ED Provider at bedside. 

## 2018-03-05 NOTE — ED Notes (Signed)
Pt given gingerale and crackers for PO challenge

## 2018-03-05 NOTE — ED Notes (Signed)
Pt was able to ambulate without assistance. Gait steady.

## 2018-03-05 NOTE — ED Provider Notes (Addendum)
[facility] EMERGENCY DEPARTMENT Provider Note   CSN: [id number] Arrival date & time: 03/04/18  2214     History   Chief Complaint Chief Complaint  Patient presents with  . Abdominal Pain    HPI Kristen Wiggins is a 47 y.o. female.  HPI  This is a 46 year old female with a history of hypertension who presents with right lower quadrant pain.  Patient reports 2-day history of worsening right lower quadrant pain.  She describes it as sharp and crampy.  It comes and goes.  She took hydrocodone with no improvement.  Does not seem to be worsened with eating.  She denies any nausea, vomiting, diarrhea, or constipation.  Currently she rates her pain at 10 out of 10.  She denies any dysuria or hematuria.  She denies any vaginal discharge.  She denies fevers.  Patient was seen at urgent care and was referred here for evaluation for possible appendicitis.  Past Medical History:  Diagnosis Date  . Hypertension     There are no active problems to display for this patient.   Past Surgical History:  Procedure Laterality Date  . CARPAL TUNNEL RELEASE    . TUMOR REMOVAL     right arm  . WISDOM TOOTH EXTRACTION       OB History   None      Home Medications    Prior to Admission medications   Medication Sig Start Date End Date Taking? Authorizing Provider  amLODipine (NORVASC) 5 MG tablet Take by mouth. 02/16/18  Yes [provider]  fluticasone (FLONASE) 50 MCG/ACT nasal spray Place into the nose. 07/16/15  Yes [provider]  lisinopril (PRINIVIL,ZESTRIL) 5 MG tablet TAKE 1 TABLET BY MOUTH EVERY DAY 10/23/17  Yes [provider]  meloxicam (MOBIC) 15 MG tablet TAKE 1 TABLET BY MOUTH EVERY DAY FOR RIGHT SCIATIC PAIN**DO NOT TAKE WITH ALEVE, MOTRIN,IBUPROFEN 08/13/15  Yes [provider]  montelukast (SINGULAIR) 10 MG tablet Take by mouth. 07/16/15  Yes [provider]  pantoprazole (PROTONIX) 40 MG tablet TAKE 1 TABLET BY MOUTH  EVERY DAY 01/01/18  Yes [provider]  Peppermint Oil (IBGARD PO) Take by mouth.   Yes [provider]  topiramate (TOPAMAX) 100 MG tablet TAKE 2 TABLETS (200 MG TOTAL) BY MOUTH NIGHTLY.CKO 11/02/17  Yes [provider]  bisoprolol-hydrochlorothiazide (ZIAC) 5-6.25 MG tablet Take 1 tablet by mouth daily.    [provider]  ondansetron (ZOFRAN ODT) 4 MG disintegrating tablet Take 1 tablet (4 mg total) by mouth every 8 (eight) hours as needed for nausea or vomiting. 05/30/16   Gwyneth Sprout, MD  polyethylene glycol (MIRALAX) packet Take 17 g by mouth daily. 03/05/18   Horton, Mayer Masker, MD    Family History No family history on file.  Social History Social History   Tobacco Use  . Smoking status: Never Smoker  . Smokeless tobacco: Never Used  Substance Use Topics  . Alcohol use: No  . Drug use: No     Allergies   Nitrofurantoin   Review of Systems Review of Systems  Constitutional: Negative for fever.  Respiratory: Negative for shortness of breath.   Cardiovascular: Negative for chest pain.  Gastrointestinal: Positive for abdominal pain. Negative for constipation, diarrhea, nausea and vomiting.  Genitourinary: Negative for dysuria and hematuria.  Musculoskeletal: Negative for back pain.  All other systems reviewed and are negative.    Physical Exam Updated Vital Signs BP 121/82   Pulse 61  Temp 98.8 F (37.1 C) (Oral)   Resp 20   Ht 1.727 m (5\' 8" )   Wt 104.3 kg   SpO2 99%   BMI 34.97 kg/m   Physical Exam  Constitutional: She is oriented to person, place, and time. She appears well-developed and well-nourished.  Overweight, no acute distress  HENT:  Head: Normocephalic and atraumatic.  Neck: Neck supple.  Cardiovascular: Normal rate, regular rhythm and normal heart sounds.  Pulmonary/Chest: Effort normal. No respiratory distress. She has no wheezes.  Abdominal: Soft. Bowel sounds are normal. There is tenderness. There  is no rebound and no guarding.  Mild right lower quadrant tenderness to palpation, no rebound or guarding, negative Rovsing's  Neurological: She is alert and oriented to person, place, and time.  Skin: Skin is warm and dry.  Psychiatric: She has a normal mood and affect.  Nursing note and vitals reviewed.    ED Treatments / Results  Labs (all labs ordered are listed, but only abnormal results are displayed) Labs Reviewed  BASIC METABOLIC PANEL - Abnormal; Notable for the following components:      Result Value   Potassium 3.4 (*)    Glucose, Bld 106 (*)    Calcium 8.5 (*)    All other components within normal limits  URINALYSIS, ROUTINE W REFLEX MICROSCOPIC  PREGNANCY, URINE  CBC WITH DIFFERENTIAL/PLATELET    EKG None  Radiology Ct Abdomen Pelvis W Contrast  Result Date: 03/05/2018 CLINICAL DATA:  47 year old female with right lower quadrant abdominal pain. EXAM: CT ABDOMEN AND PELVIS WITH CONTRAST TECHNIQUE: Multidetector CT imaging of the abdomen and pelvis was performed using the standard protocol following bolus administration of intravenous contrast. CONTRAST:  ISOVUE-300 IOPAMIDOL (ISOVUE-300) INJECTION 61% COMPARISON:  CT of the abdomen pelvis dated 05/30/2016 FINDINGS: Lower chest: The visualized lung bases are clear. No intra-abdominal free air or free fluid. Hepatobiliary: No focal liver abnormality is seen. No gallstones, gallbladder wall thickening, or biliary dilatation. Pancreas: Unremarkable. No pancreatic ductal dilatation or surrounding inflammatory changes. Spleen: Normal in size without focal abnormality. Adrenals/Urinary Tract: Adrenal glands are unremarkable. Kidneys are normal, without renal calculi, focal lesion, or hydronephrosis. Bladder is unremarkable. Stomach/Bowel: Stomach is within normal limits. Appendix appears normal. No evidence of bowel wall thickening, distention, or inflammatory changes. Vascular/Lymphatic: No significant vascular findings are  present. No enlarged abdominal or pelvic lymph nodes. Reproductive: The uterus is anteverted. Small anterior body fibroid noted. The ovaries are grossly unremarkable. Other: None Musculoskeletal: No acute or significant osseous findings. IMPRESSION: No acute intra-abdominal or pelvic pathology.Normal appendix. Electronically Signed   By: Elgie Collard M.D.   On: 03/05/2018 00:51    Procedures Procedures (including critical care time)  Medications Ordered in ED Medications  morphine 4 MG/ML injection 4 mg (4 mg Intravenous Given 03/04/18 2334)  ondansetron (ZOFRAN) injection 4 mg (4 mg Intravenous Given 03/04/18 2334)  iopamidol (ISOVUE-300) 61 % injection 100 mL (100 mLs Intravenous Contrast Given 03/05/18 0015)  HYDROmorphone (DILAUDID) injection 0.5 mg (0.5 mg Intravenous Given 03/05/18 0010)     Initial Impression / Assessment and Plan / ED Course  I have reviewed the triage vital signs and the nursing notes.  Pertinent labs & imaging results that were available during my care of the patient were reviewed by me and considered in my medical decision making (see chart for details).     Presents with abdominal pain.  She is overall nontoxic-appearing and vital signs are reassuring.  She has some mild right lower quadrant  tenderness without signs of peritonitis.  No Rovsing's.  Considerations include appendicitis, ovarian pathology constipation, less likely gallbladder pancreas pathology given location.  Patient was given pain and nausea medication.  Basic lab work obtained and largely reassuring.  No significant leukocytosis.  Urinalysis without evidence of UTI.  CT scan led as negative for acute appendicitis.  Ovaries appear normal and without edema or any indication of torsion.  I do not have access to ultrasound at this time but given the patient's exam is fairly reassuring, would doubt ovarian torsion.  I have reviewed the CT scan.  She does have a significant amount of stool in the  right lower quadrant.  This could be causing some of her symptoms.  I discussed with the patient and her husband trialing MiraLAX.  Tylenol as needed for discomfort.  I have offered to schedule an ultrasound for later today; this will be scheduled.  She is able to tolerate fluids prior to discharge.  After history, exam, and medical workup I feel the patient has been appropriately medically screened and is safe for discharge home. Pertinent diagnoses were discussed with the patient. Patient was given return precautions.   Final Clinical Impressions(s) / ED Diagnoses   Final diagnoses:  Right lower quadrant abdominal pain  Constipation, unspecified constipation type    ED Discharge Orders         Ordered    polyethylene glycol (MIRALAX) packet  Daily     03/05/18 0127           Shon Baton, MD 03/05/18 1610    Shon Baton, MD 03/05/18 [phone]

## 2018-04-05 ENCOUNTER — Other Ambulatory Visit: Payer: Self-pay

## 2018-04-05 ENCOUNTER — Emergency Department (HOSPITAL_BASED_OUTPATIENT_CLINIC_OR_DEPARTMENT_OTHER)
Admission: EM | Admit: 2018-04-05 | Discharge: 2018-04-05 | Disposition: A | Discharge status: Home / Self Care | Payer: BLUE CROSS/BLUE SHIELD | Attending: Emergency Medicine | Admitting: Emergency Medicine

## 2018-04-05 ENCOUNTER — Encounter (HOSPITAL_BASED_OUTPATIENT_CLINIC_OR_DEPARTMENT_OTHER): Payer: Self-pay | Admitting: *Deleted

## 2018-04-05 DIAGNOSIS — R109 Unspecified abdominal pain: Secondary | ICD-10-CM

## 2018-04-05 DIAGNOSIS — I10 Essential (primary) hypertension: Secondary | ICD-10-CM | POA: Insufficient documentation

## 2018-04-05 DIAGNOSIS — R1030 Lower abdominal pain, unspecified: Secondary | ICD-10-CM | POA: Insufficient documentation

## 2018-04-05 DIAGNOSIS — R110 Nausea: Secondary | ICD-10-CM | POA: Diagnosis not present

## 2018-04-05 DIAGNOSIS — Z79899 Other long term (current) drug therapy: Secondary | ICD-10-CM | POA: Insufficient documentation

## 2018-04-05 DIAGNOSIS — Z3202 Encounter for pregnancy test, result negative: Secondary | ICD-10-CM | POA: Diagnosis not present

## 2018-04-05 DIAGNOSIS — N938 Other specified abnormal uterine and vaginal bleeding: Secondary | ICD-10-CM | POA: Diagnosis not present

## 2018-04-05 LAB — WET PREP, GENITAL
Clue Cells Wet Prep HPF POC: NONE SEEN
Sperm: NONE SEEN
TRICH WET PREP: NONE SEEN
Yeast Wet Prep HPF POC: NONE SEEN

## 2018-04-05 LAB — URINALYSIS, ROUTINE W REFLEX MICROSCOPIC
BILIRUBIN URINE: NEGATIVE
GLUCOSE, UA: 100 mg/dL — AB
Ketones, ur: NEGATIVE mg/dL
Leukocytes, UA: NEGATIVE
Nitrite: POSITIVE — AB
PH: 6 (ref 5.0–8.0)
Protein, ur: NEGATIVE mg/dL
SPECIFIC GRAVITY, URINE: 1.01 (ref 1.005–1.030)

## 2018-04-05 LAB — CBC WITH DIFFERENTIAL/PLATELET
Abs Immature Granulocytes: 0.01 10*3/uL (ref 0.00–0.07)
BASOS PCT: 1 %
Basophils Absolute: 0.1 10*3/uL (ref 0.0–0.1)
EOS ABS: 0.1 10*3/uL (ref 0.0–0.5)
Eosinophils Relative: 2 %
HCT: 40.5 % (ref 36.0–46.0)
Hemoglobin: 12.9 g/dL (ref 12.0–15.0)
IMMATURE GRANULOCYTES: 0 %
Lymphocytes Relative: 24 %
Lymphs Abs: 1.7 10*3/uL (ref 0.7–4.0)
MCH: 29.7 pg (ref 26.0–34.0)
MCHC: 31.9 g/dL (ref 30.0–36.0)
MCV: 93.1 fL (ref 80.0–100.0)
MONOS PCT: 8 %
Monocytes Absolute: 0.6 10*3/uL (ref 0.1–1.0)
NEUTROS PCT: 65 %
NRBC: 0 % (ref 0.0–0.2)
Neutro Abs: 4.5 10*3/uL (ref 1.7–7.7)
PLATELETS: 231 10*3/uL (ref 150–400)
RBC: 4.35 MIL/uL (ref 3.87–5.11)
RDW: 13.5 % (ref 11.5–15.5)
WBC: 6.9 10*3/uL (ref 4.0–10.5)

## 2018-04-05 LAB — COMPREHENSIVE METABOLIC PANEL
ALK PHOS: 60 U/L (ref 38–126)
ALT: 16 U/L (ref 0–44)
ANION GAP: 7 (ref 5–15)
AST: 16 U/L (ref 15–41)
Albumin: 4 g/dL (ref 3.5–5.0)
BILIRUBIN TOTAL: 0.7 mg/dL (ref 0.3–1.2)
BUN: 9 mg/dL (ref 6–20)
CALCIUM: 8.5 mg/dL — AB (ref 8.9–10.3)
CO2: 22 mmol/L (ref 22–32)
Chloride: 110 mmol/L (ref 98–111)
Creatinine, Ser: 0.89 mg/dL (ref 0.44–1.00)
GLUCOSE: 85 mg/dL (ref 70–99)
Potassium: 3.1 mmol/L — ABNORMAL LOW (ref 3.5–5.1)
Sodium: 139 mmol/L (ref 135–145)
TOTAL PROTEIN: 6.9 g/dL (ref 6.5–8.1)

## 2018-04-05 LAB — URINALYSIS, MICROSCOPIC (REFLEX)
Bacteria, UA: NONE SEEN
Squamous Epithelial / LPF: NONE SEEN (ref 0–5)
WBC UA: NONE SEEN WBC/hpf (ref 0–5)

## 2018-04-05 LAB — PREGNANCY, URINE: Preg Test, Ur: NEGATIVE

## 2018-04-05 MED ORDER — FENTANYL CITRATE (PF) 100 MCG/2ML IJ SOLN
50.0000 ug | Freq: Once | INTRAMUSCULAR | Status: AC
  Administered 2018-04-05: 50 ug via INTRAVENOUS
  Filled 2018-04-05: qty 2

## 2018-04-05 MED ORDER — NAPROXEN 500 MG PO TABS: 500.0000 mg | ORAL_TABLET | Freq: Two times a day (BID) | ORAL | 0 refills | Status: AC

## 2018-04-05 MED ORDER — POTASSIUM CHLORIDE CRYS ER 20 MEQ PO TBCR
40.0000 meq | EXTENDED_RELEASE_TABLET | Freq: Once | ORAL | Status: AC
  Administered 2018-04-05: 40 meq via ORAL
  Filled 2018-04-05: qty 2

## 2018-04-05 MED ORDER — KETOROLAC TROMETHAMINE 15 MG/ML IJ SOLN
15.0000 mg | Freq: Once | INTRAMUSCULAR | Status: AC
  Administered 2018-04-05: 15 mg via INTRAVENOUS
  Filled 2018-04-05: qty 1

## 2018-04-05 MED ORDER — SODIUM CHLORIDE 0.9 % IV BOLUS
1000.0000 mL | Freq: Once | INTRAVENOUS | Status: AC
  Administered 2018-04-05: 1000 mL via INTRAVENOUS

## 2018-04-05 NOTE — ED Triage Notes (Addendum)
2 days of lower abdominal pain into her rectum. Pain when she urinates.

## 2018-04-05 NOTE — ED Notes (Signed)
ED Provider at bedside. 

## 2018-04-05 NOTE — ED Notes (Signed)
Family at bedside. 

## 2018-04-05 NOTE — Discharge Instructions (Addendum)
You are seen in the emergency department today for abdominal pain.  Your work-up was overall reassuring.  Your potassium was slightly low at 3.1, you were given a potassium supplement in the ER, your calcium was also slightly low at 8.5, please be sure to include potassium and calcium rich foods in your diet.  You are unsure the exact cause of your symptoms, we suspect this may be due to menstruation, or may be multifactorial.  We are sending you with a prescription for naproxen to help with pain.  - Naproxen is a nonsteroidal anti-inflammatory medication that will help with pain and swelling. Be sure to take this medication as prescribed with food, 1 pill every 12 hours,  It should be taken with food, as it can cause stomach upset, and more seriously, stomach bleeding. Do not take other nonsteroidal anti-inflammatory medications with this such as Advil, Motrin, Aleve, Mobic, Goodie Powder, or Motrin.    You make take Tylenol per over the counter dosing with these medications.   We have prescribed you new medication(s) today. Discuss the medications prescribed today with your pharmacist as they can have adverse effects and interactions with your other medicines including over the counter and prescribed medications. Seek medical evaluation if you start to experience new or abnormal symptoms after taking one of these medicines, seek care immediately if you start to experience difficulty breathing, feeling of your throat closing, facial swelling, or rash as these could be indications of a more serious allergic reaction   We would like you to follow-up closely with your specialist including your OB/GYN provider as well as your GI provider, please do so within the next 3 to 5 days.  Return to the ER should you experience new or worsening symptoms including but not limited to inability to keep fluids down, fever, worsening pain, or any other concerns.

## 2018-04-05 NOTE — ED Provider Notes (Signed)
[facility] EMERGENCY DEPARTMENT Provider Note   CSN: [id number] Arrival date & time: 04/05/18  1314     History   Chief Complaint Chief Complaint  Patient presents with  . Abdominal Pain    HPI Kristen Wiggins is a 47 y.o. female with a hx of hypertension who presents to the ED with complaints of abdominal pain x 2 days. Pain is in the lower abdomen. Pain is constant and a cramping sensation with intermittent increases in pain which radiate to the rectum. Pain is currently an 8/10 in severity. Pain is worse if she sits on her bottom, no alleviating factors. Tried ibuprofen without much change and then took a hydrocodone without much change. Reports associated nausea without vomiting. Patient states she was having normal bowel movements, however given she had a hx of similar pain with diagnosis of constipation she did try an enema last night w/ a bowel movement, but this did not change sxs. Patient is taking her miralax daily without issue. Denies fever, chills, vomiting, diarrhea, dysuria, melena, hematochezia, or vaginal discharge. Has noted some mild vaginal spotting. She states that her menstrual cycle has been irregular since March due to an ablation. LMP 10/26. Patient sexually active in a monogamous relationship w/ her husband, no concern for STD.    HPI  Past Medical History:  Diagnosis Date  . Hypertension     There are no active problems to display for this patient.   Past Surgical History:  Procedure Laterality Date  . CARPAL TUNNEL RELEASE    . TUMOR REMOVAL     right arm  . WISDOM TOOTH EXTRACTION       OB History   None      Home Medications    Prior to Admission medications   Medication Sig Start Date End Date Taking? Authorizing Provider  amLODipine (NORVASC) 5 MG tablet Take by mouth. 02/16/18   [provider]  bisoprolol-hydrochlorothiazide (ZIAC) 5-6.25 MG tablet Take 1 tablet by mouth daily.    [provider]    fluticasone (FLONASE) 50 MCG/ACT nasal spray Place into the nose. 07/16/15   [provider]  lisinopril (PRINIVIL,ZESTRIL) 5 MG tablet TAKE 1 TABLET BY MOUTH EVERY DAY 10/23/17   [provider]  meloxicam (MOBIC) 15 MG tablet TAKE 1 TABLET BY MOUTH EVERY DAY FOR RIGHT SCIATIC PAIN**DO NOT TAKE WITH ALEVE, MOTRIN,IBUPROFEN 08/13/15   [provider]  montelukast (SINGULAIR) 10 MG tablet Take by mouth. 07/16/15   [provider]  ondansetron (ZOFRAN ODT) 4 MG disintegrating tablet Take 1 tablet (4 mg total) by mouth every 8 (eight) hours as needed for nausea or vomiting. 05/30/16   Gwyneth SproutPlunkett, Whitney, MD  pantoprazole (PROTONIX) 40 MG tablet TAKE 1 TABLET BY MOUTH EVERY DAY 01/01/18   [provider]  Peppermint Oil (IBGARD PO) Take by mouth.    [provider]  polyethylene glycol (MIRALAX) packet Take 17 g by mouth daily. 03/05/18   Horton, Mayer Maskerourtney F, MD  topiramate (TOPAMAX) 100 MG tablet TAKE 2 TABLETS (200 MG TOTAL) BY MOUTH NIGHTLY.CKO 11/02/17   [provider]    Family History No family history on file.  Social History Social History   Tobacco Use  . Smoking status: Never Smoker  . Smokeless tobacco: Never Used  Substance Use Topics  . Alcohol use: No  . Drug use: No     Allergies   Nitrofurantoin   Review of Systems Review of Systems  Constitutional: Negative for chills  and fever.  Respiratory: Negative for shortness of breath.   Cardiovascular: Negative for chest pain.  Gastrointestinal: Positive for abdominal pain and nausea. Negative for anal bleeding, blood in stool, constipation, diarrhea and vomiting.  Genitourinary: Positive for vaginal bleeding (spotting this AM). Negative for dysuria and vaginal discharge.  All other systems reviewed and are negative.    Physical Exam Updated Vital Signs BP (!) 132/94   Pulse 80   Temp 98.4 F (36.9 C) (Oral)   Resp 20   Ht 5\' 8"  (1.727 m)   Wt 104.8 kg   SpO2  98%   BMI 35.12 kg/m   Physical Exam  Constitutional: She appears well-developed and well-nourished.  Non-toxic appearance. No distress.  HENT:  Head: Normocephalic and atraumatic.  Eyes: Conjunctivae are normal. Right eye exhibits no discharge. Left eye exhibits no discharge.  Neck: Neck supple.  Cardiovascular: Normal rate and regular rhythm.  Pulmonary/Chest: Effort normal and breath sounds normal. No respiratory distress. She has no wheezes. She has no rhonchi. She has no rales.  Respiration even and unlabored  Abdominal: Soft. She exhibits no distension. There is tenderness (diffuse lower abdomen, mostly pelvic area). There is no rigidity, no rebound, no guarding, no CVA tenderness, no tenderness at McBurney's point and negative Murphy's sign.  Genitourinary: Pelvic exam was performed with patient supine. There is no tenderness on the right labia. There is no tenderness on the left labia. Cervix exhibits no discharge and no friability. Right adnexum displays no mass and no fullness. Left adnexum displays no mass and no fullness. There is bleeding (mild to moderate amount of blood in the vaginal vault) in the vagina. No vaginal discharge found.  Genitourinary Comments: Diffusely uncomfortable with pelvic without focal tenderness or specific CMT.  Rectal exam with external non thrombosed hemorrhoids which are non tender to palpation. Soft brown stool noted. No bright red blood. Nontender on DRE.  RN present as Biomedical engineer.   Neurological: She is alert.  Clear speech.   Skin: Skin is warm and dry. No rash noted.  Psychiatric: She has a normal mood and affect. Her behavior is normal.  Nursing note and vitals reviewed.    ED Treatments / Results  Labs (all labs ordered are listed, but only abnormal results are displayed) Labs Reviewed  WET PREP, GENITAL - Abnormal; Notable for the following components:      Result Value   WBC, Wet Prep HPF POC MANY (*)    All other components within  normal limits  URINALYSIS, ROUTINE W REFLEX MICROSCOPIC - Abnormal; Notable for the following components:   Color, Urine ORANGE (*)    Glucose, UA 100 (*)    Hgb urine dipstick TRACE (*)    Nitrite POSITIVE (*)    All other components within normal limits  COMPREHENSIVE METABOLIC PANEL - Abnormal; Notable for the following components:   Potassium 3.1 (*)    Calcium 8.5 (*)    All other components within normal limits  URINE CULTURE  CBC WITH DIFFERENTIAL/PLATELET  PREGNANCY, URINE  URINALYSIS, MICROSCOPIC (REFLEX)  GC/CHLAMYDIA PROBE AMP ([place]) NOT AT [facility]    EKG None  Radiology No results found.  Procedures Procedures (including critical care time)  Medications Ordered in ED Medications - No data to display   Initial Impression / Assessment and Plan / ED Course  I have reviewed the triage vital signs and the nursing notes.  Pertinent labs & imaging results that were available during my care of the patient were reviewed  by me and considered in my medical decision making (see chart for details).    Patient presents to the ED with complaints of abdominal pain. Patient nontoxic appearing, in no apparent distress, vitals WNL other than elevated BP, doubt HTN emergengy, normalized with repeat vitals. On exam patient tender to the pelvic area/lower abdomen, also diffusely uncomfortable throughout pelvic exam. No peritoneal signs. Rectal exam with non thrombosed nontender external hemorrhoids, nontender DRE exam making proctitis less likely. No gross blood on DRE. Will evaluate with labs, analgesics, anti-emetics, and fluids administered.   Labs reviewed and grossly unremarkable. No leukocytosis, no anemia, no significant electrolyte derangements- mild hypokalemia and hypocalcemia noted. LFTs and renal function WNL. Urinalysis without obvious infection- nitrite positive will culture, however no urinary sxs making UTI less likely. Wet prep without BV/trich/yeast. There are many  WBCs on wet prep, GC/chlamydia pending, patient reports no concern for STD given monogamous with her husband of > 30 years, therefore feel PID is less likely. Given diffuse discomfort as opposed to unilateral feel that ovarian torsion is less likely as well. She has no peritoneal signs on her abdominal exam and had a CT abdomen/pelvis w/ contrast 1 month prior for what she reports were similar sxs which has been reviewed and is unremarkable, no diverticulosis to raise concern for diverticulitis, no appendicitis or other concerning findings. Given similar presentation without peritoneal signs today I do not feel repeat imaging is necessary at this time, doubt obstruction/perforation, appendicitis, diverticulitis, or cholecystitis. At this time unclear etiology, pain may be secondary to menses given vaginal bleeding. She is feeling much better here in the ER, tolerating PO, feels ready to go home. She has a GI and OBGYN specialist- recommended she follow up with each. Will discharge home with supportive measures. I discussed results, treatment plan, need for follow-up, and return precautions with the patient. Provided opportunity for questions, patient confirmed understanding and is in agreement with plan.   Findings and plan of care discussed with supervising physician Dr. Rubin Payor who is in agreement.   Vitals:   04/05/18 1327 04/05/18 1604  BP: (!) 132/94 116/84  Pulse: 80 75  Resp: 20 18  Temp: 98.4 F (36.9 C)   SpO2: 98% 97%    Final Clinical Impressions(s) / ED Diagnoses   Final diagnoses:  Abdominal pain, unspecified abdominal location    ED Discharge Orders         Ordered    naproxen (NAPROSYN) 500 MG tablet  2 times daily     04/05/18 1617           Almir Botts, Pleas Koch, PA-C 04/05/18 2034    Benjiman Core, MD 04/06/18 [phone]

## 2018-04-06 LAB — GC/CHLAMYDIA PROBE AMP (~~LOC~~) NOT AT ARMC
Chlamydia: NEGATIVE
NEISSERIA GONORRHEA: NEGATIVE

## 2018-04-07 LAB — URINE CULTURE: Culture: NO GROWTH

## 2019-03-18 ENCOUNTER — Other Ambulatory Visit: Payer: Self-pay

## 2019-03-18 ENCOUNTER — Emergency Department (HOSPITAL_BASED_OUTPATIENT_CLINIC_OR_DEPARTMENT_OTHER): Payer: BLUE CROSS/BLUE SHIELD

## 2019-03-18 ENCOUNTER — Encounter (HOSPITAL_BASED_OUTPATIENT_CLINIC_OR_DEPARTMENT_OTHER): Payer: Self-pay

## 2019-03-18 ENCOUNTER — Emergency Department (HOSPITAL_BASED_OUTPATIENT_CLINIC_OR_DEPARTMENT_OTHER)
Admission: EM | Admit: 2019-03-18 | Discharge: 2019-03-18 | Disposition: A | Discharge status: Home / Self Care | Payer: BLUE CROSS/BLUE SHIELD | Attending: Emergency Medicine | Admitting: Emergency Medicine

## 2019-03-18 DIAGNOSIS — Z79899 Other long term (current) drug therapy: Secondary | ICD-10-CM | POA: Diagnosis not present

## 2019-03-18 DIAGNOSIS — I10 Essential (primary) hypertension: Secondary | ICD-10-CM | POA: Insufficient documentation

## 2019-03-18 DIAGNOSIS — M79605 Pain in left leg: Secondary | ICD-10-CM | POA: Diagnosis not present

## 2019-03-18 DIAGNOSIS — M79662 Pain in left lower leg: Secondary | ICD-10-CM | POA: Diagnosis present

## 2019-03-18 NOTE — ED Triage Notes (Signed)
Pt c/o cramping pain to left calf x 1 week-denies injury-NAD-steady gait

## 2019-03-18 NOTE — ED Provider Notes (Signed)
[facility] EMERGENCY DEPARTMENT Provider Note   CSN: [id number] Arrival date & time: 03/18/19  1354     History   Chief Complaint Chief Complaint  Patient presents with   Leg Pain    HPI Kristen Wiggins is a 48 y.o. female.     Is a 48 year old female past medical history of hypertension.  Patient reports that she is here for left calf pain and cramping.  This is been going on for about 1 week.  She reports that she had a friend who told her that this could possibly be a blood clot so she came here to be evaluated.  She denies any injury or trauma.  Reports that it feels like her leg is swelling sometimes.  She does have a history of intermittent back problems.  She reports occasional numbness in the leg.  Denies any tingling, saddle anesthesia, loss of control of bowel or bladder, fever.     Past Medical History:  Diagnosis Date   Hypertension     There are no active problems to display for this patient.   Past Surgical History:  Procedure Laterality Date   CARPAL TUNNEL RELEASE     TUMOR REMOVAL     right arm   WISDOM TOOTH EXTRACTION       OB History   No obstetric history on file.      Home Medications    Prior to Admission medications   Medication Sig Start Date End Date Taking? Authorizing Provider  amLODipine (NORVASC) 5 MG tablet Take by mouth. 02/16/18   [provider]  bisoprolol-hydrochlorothiazide (ZIAC) 5-6.25 MG tablet Take 1 tablet by mouth daily.    [provider]  fluticasone (FLONASE) 50 MCG/ACT nasal spray Place into the nose. 07/16/15   [provider]  lisinopril (PRINIVIL,ZESTRIL) 5 MG tablet TAKE 1 TABLET BY MOUTH EVERY DAY 10/23/17   [provider]  meloxicam (MOBIC) 15 MG tablet TAKE 1 TABLET BY MOUTH EVERY DAY FOR RIGHT SCIATIC PAIN**DO NOT TAKE WITH ALEVE, MOTRIN,IBUPROFEN 08/13/15   [provider]  montelukast (SINGULAIR) 10 MG tablet Take by mouth. 07/16/15   [provider]  naproxen (NAPROSYN) 500 MG tablet Take 1 tablet (500 mg total) by mouth 2 (two) times daily. 04/05/18   Petrucelli, Samantha R, PA-C  ondansetron (ZOFRAN ODT) 4 MG disintegrating tablet Take 1 tablet (4 mg total) by mouth every 8 (eight) hours as needed for nausea or vomiting. 05/30/16   Blanchie Dessert, MD  pantoprazole (PROTONIX) 40 MG tablet TAKE 1 TABLET BY MOUTH EVERY DAY 01/01/18   [provider]  Peppermint Oil (IBGARD PO) Take by mouth.    [provider]  polyethylene glycol (MIRALAX) packet Take 17 g by mouth daily. 03/05/18   Horton, Barbette Hair, MD  topiramate (TOPAMAX) 100 MG tablet TAKE 2 TABLETS (200 MG TOTAL) BY MOUTH NIGHTLY.CKO 11/02/17   [provider]    Family History No family history on file.  Social History Social History   Tobacco Use   Smoking status: Never Smoker   Smokeless tobacco: Never Used  Substance Use Topics   Alcohol use: No   Drug use: No     Allergies   Nitrofurantoin   Review of Systems Review of Systems  Constitutional: Negative for chills and fever.  Respiratory: Negative for cough and shortness of breath.   Cardiovascular: Positive for leg swelling. Negative for chest pain and palpitations.  Gastrointestinal: Negative for abdominal pain, nausea and vomiting.  Musculoskeletal: Positive for back pain. Negative for arthralgias.  Skin: Positive for rash. Negative for wound.  Neurological: Negative for dizziness, light-headedness and headaches.  Hematological: Does not bruise/bleed easily.     Physical Exam Updated Vital Signs BP (!) 131/101 (BP Location: Left Arm)    Pulse 73    Temp 99.9 F (37.7 C) (Oral)    Resp 14    Ht 5\' 8"  (1.727 m)    Wt 105.7 kg    SpO2 100%    BMI 35.43 kg/m   Physical Exam Vitals signs and nursing note reviewed.  Constitutional:      General: She is not in acute distress.    Appearance: Normal appearance. She is obese. She is not ill-appearing,  toxic-appearing or diaphoretic.  HENT:     Head: Normocephalic.  Eyes:     Conjunctiva/sclera: Conjunctivae normal.  Pulmonary:     Effort: Pulmonary effort is normal.  Musculoskeletal:     Right lower leg: No edema.     Left lower leg: No edema.     Comments: Lower extremity strength, sensation and pulses are equal bilaterally.  Negative Homans' sign.  No tenderness to palpation.  Skin:    General: Skin is dry.     Capillary Refill: Capillary refill takes less than 2 seconds.     Findings: No bruising, erythema, lesion or rash.  Neurological:     Mental Status: She is alert.  Psychiatric:        Mood and Affect: Mood normal.      ED Treatments / Results  Labs (all labs ordered are listed, but only abnormal results are displayed) Labs Reviewed - No data to display  EKG None  Radiology [place] Venous Img Lower  Left (dvt Study)  Result Date: 03/18/2019 CLINICAL DATA:  Left lower extremity pain and edema. EXAM: LEFT LOWER EXTREMITY VENOUS DOPPLER ULTRASOUND TECHNIQUE: Gray-scale sonography with graded compression, as well as color Doppler and duplex ultrasound were performed to evaluate the lower extremity deep venous systems from the level of the common femoral vein and including the common femoral, femoral, profunda femoral, popliteal and calf veins including the posterior tibial, peroneal and gastrocnemius veins when visible. The superficial great saphenous vein was also interrogated. Spectral Doppler was utilized to evaluate flow at rest and with distal augmentation maneuvers in the common femoral, femoral and popliteal veins. COMPARISON:  None. FINDINGS: Contralateral Common Femoral Vein: Respiratory phasicity is normal and symmetric with the symptomatic side. No evidence of thrombus. Normal compressibility. Common Femoral Vein: No evidence of thrombus. Normal compressibility, respiratory phasicity and response to augmentation. Saphenofemoral Junction: No evidence of thrombus. Normal  compressibility and flow on color Doppler imaging. Profunda Femoral Vein: No evidence of thrombus. Normal compressibility and flow on color Doppler imaging. Femoral Vein: No evidence of thrombus. Normal compressibility, respiratory phasicity and response to augmentation. Popliteal Vein: No evidence of thrombus. Normal compressibility, respiratory phasicity and response to augmentation. Calf Veins: No evidence of thrombus. Normal compressibility and flow on color Doppler imaging. Superficial Great Saphenous Vein: No evidence of thrombus. Normal compressibility. Venous Reflux:  None. Other Findings: No evidence of superficial thrombophlebitis or abnormal fluid collection. IMPRESSION: No evidence of left lower extremity deep venous thrombosis. Electronically Signed   By: Irish LackGlenn  Yamagata M.D.   On: 03/18/2019 17:04    Procedures Procedures (including critical care time)  Medications Ordered in ED Medications - No data to display   Initial Impression / Assessment and Plan / ED Course  I have  reviewed the triage vital signs and the nursing notes.  Pertinent labs & imaging results that were available during my care of the patient were reviewed by me and considered in my medical decision making (see chart for details).  Clinical Course as of Mar 17 1729  [facility] Mar 18, 2019  1729 Patient with left lower calf pain for 1 week.  DVT study performed and was negative.  No significant skin findings.  Normal musculoskeletal exam.  Neurovascularly intact.  Other etiologies include lumbar radicular pain.  Advised to follow-up with primary care doctor and to start NSAIDs in the meantime.   [KM]    Clinical Course User Index [KM] Arlyn Dunning, PA-C       Based on review of vitals, medical screening exam, lab work and/or imaging, there does not appear to be an acute, emergent etiology for the patient's symptoms. Counseled pt on good return precautions and encouraged both PCP and ED follow-up as  needed.  Prior to discharge, I also discussed incidental imaging findings with patient in detail and advised appropriate, recommended follow-up in detail.  Clinical Impression: 1. Left leg pain     Disposition: Discharge  Prior to providing a prescription for a controlled substance, I independently reviewed the patient's recent prescription history on the [place] Controlled Substance Reporting System. The patient had no recent or regular prescriptions and was deemed appropriate for a brief, less than 3 day prescription of narcotic for acute analgesia.  This note was prepared with assistance of Conservation officer, historic buildings. Occasional wrong-word or sound-a-like substitutions may have occurred due to the inherent limitations of voice recognition software.   Final Clinical Impressions(s) / ED Diagnoses   Final diagnoses:  Left leg pain    ED Discharge Orders    None       Jeral Pinch 03/18/19 1730    Linwood Dibbles, MD 03/18/19 [phone]

## 2019-03-18 NOTE — Discharge Instructions (Signed)
You are seen today for left leg pain and cramping.  Your ultrasound was negative for any blood clot.  It is possible that your pain could be coming from a strained muscle or possibly from issues in your back.  Please follow-up with your primary care doctor.  In the meantime you should start over-the-counter anti-inflammatory medicines such as Advil or ibuprofen.  Apply ice and alternate with heat to help with inflammation and to relax the muscles. Thank you for allowing me to care for you today. Please return to the emergency department if you have new or worsening symptoms.

## 2019-08-10 ENCOUNTER — Ambulatory Visit: Payer: BLUE CROSS/BLUE SHIELD | Attending: Internal Medicine

## 2019-08-10 DIAGNOSIS — Z23 Encounter for immunization: Secondary | ICD-10-CM

## 2019-08-10 NOTE — Progress Notes (Signed)
   Covid-19 Vaccination Clinic  Name:  KATEY BARRIE    MRN: [id number] DOB: [birth year]  08/10/2019  Ms. Shadden was observed post Covid-19 immunization for 15 minutes without incident. She was provided with Vaccine Information Sheet and instruction to access the V-Safe system.   Ms. Englert was instructed to call 911 with any severe reactions post vaccine: Marland Kitchen Difficulty breathing  . Swelling of face and throat  . A fast heartbeat  . A bad rash all over body  . Dizziness and weakness   Immunizations Administered    Name Date Dose VIS Date Route   Pfizer COVID-19 Vaccine 08/10/2019 10:18 AM 0.3 mL 04/19/2019 Intramuscular   Manufacturer: [employer], [employer]   Lot: [id number]   NDC: [address]-6

## 2019-09-03 ENCOUNTER — Ambulatory Visit: Payer: BLUE CROSS/BLUE SHIELD | Attending: Internal Medicine

## 2019-09-03 DIAGNOSIS — Z23 Encounter for immunization: Secondary | ICD-10-CM

## 2019-09-03 NOTE — Progress Notes (Signed)
   Covid-19 Vaccination Clinic  Name:  Kristen Wiggins    MRN: [id number] DOB: 1971/02/03  09/03/2019  [name] was observed post Covid-19 immunization for 15 minutes without incident. She was provided with Vaccine Information Sheet and instruction to access the V-Safe system.   [name] was instructed to call 911 with any severe reactions post vaccine: Marland Kitchen Difficulty breathing  . Swelling of face and throat  . A fast heartbeat  . A bad rash all over body  . Dizziness and weakness   Immunizations Administered    Name Date Dose VIS Date Route   Pfizer COVID-19 Vaccine 09/03/2019  4:09 PM 0.3 mL 07/03/2018 Intramuscular   Manufacturer: [employer], [employer]   Lot: [id number]   NDC: [address]-2

## 2020-04-12 IMAGING — US US EXTREM LOW VENOUS*L*
1 series · 13 of 24 positions shown · non-contrast
Comparison: None.

CLINICAL DATA: Left lower extremity pain and edema.



[Series 1: us extrem low venous*left* · 13 of 30 slices shown]
[im 1/30]
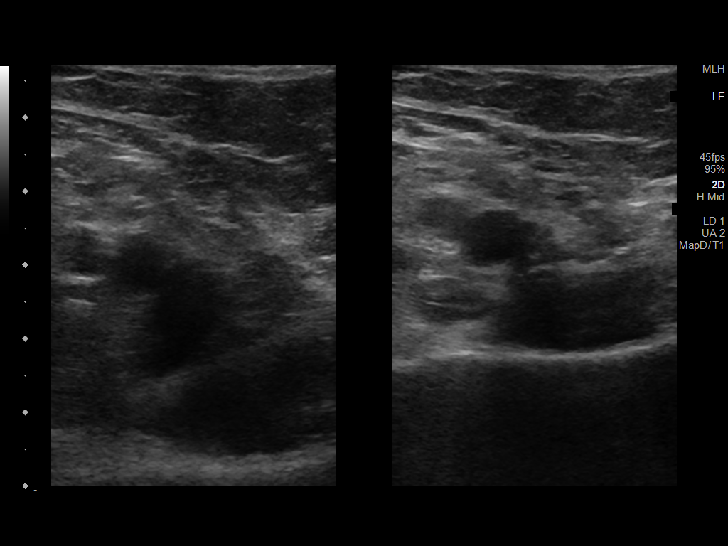
[im 3/30]
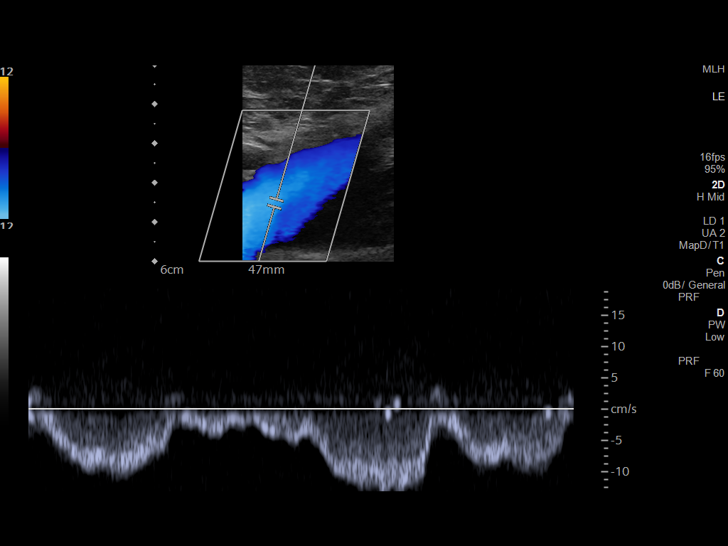
[im 6/30]
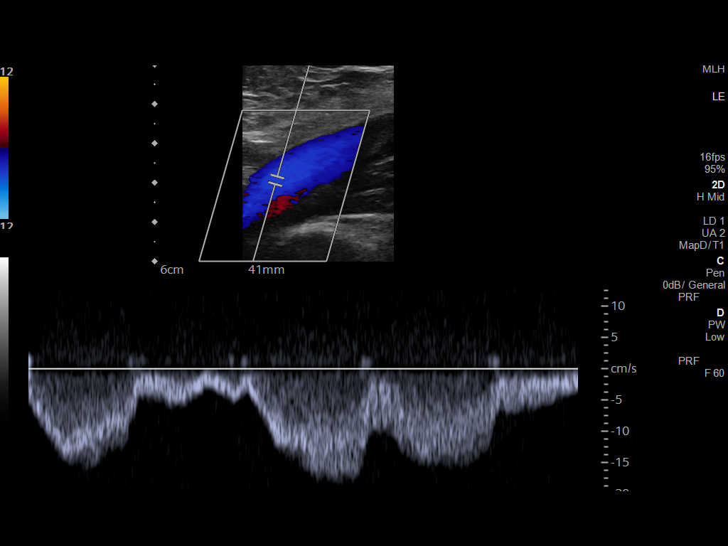
[im 8/30]
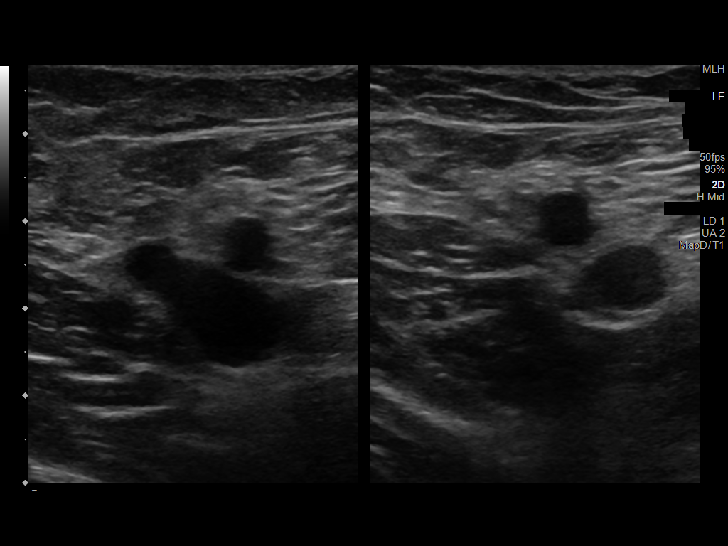
[im 11/30]
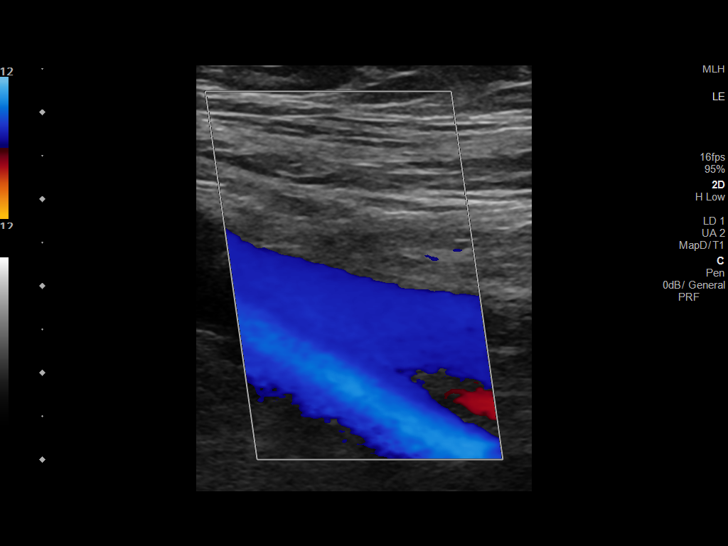
[im 13/30]
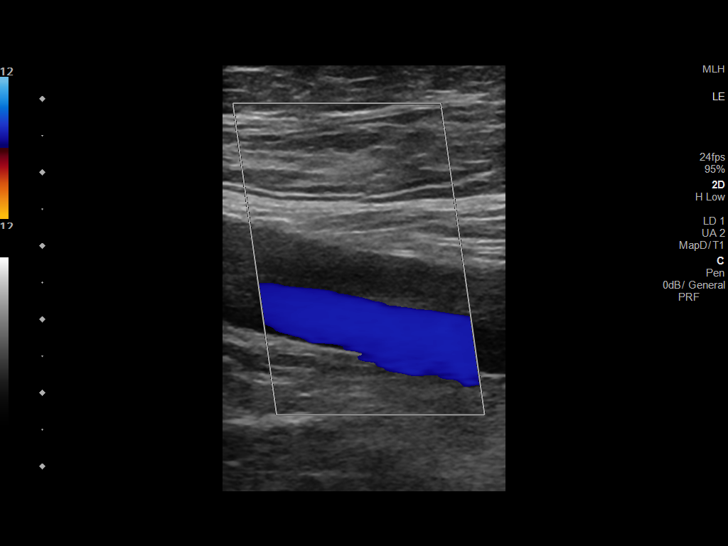
[im 16/30]
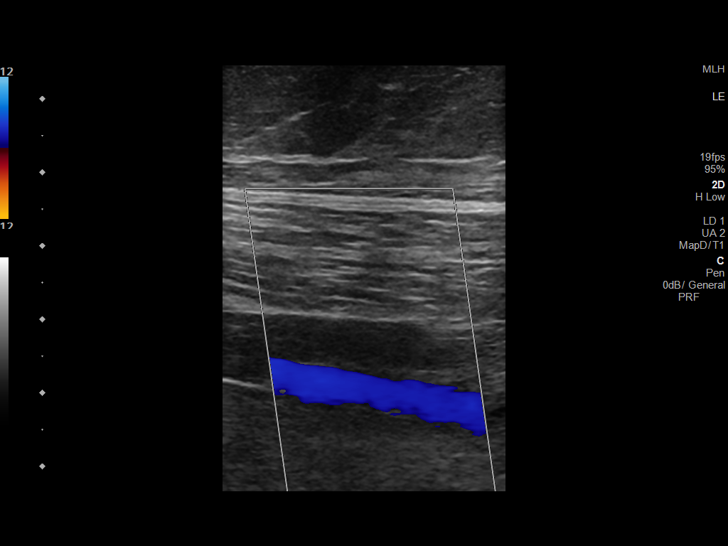
[im 17/30]
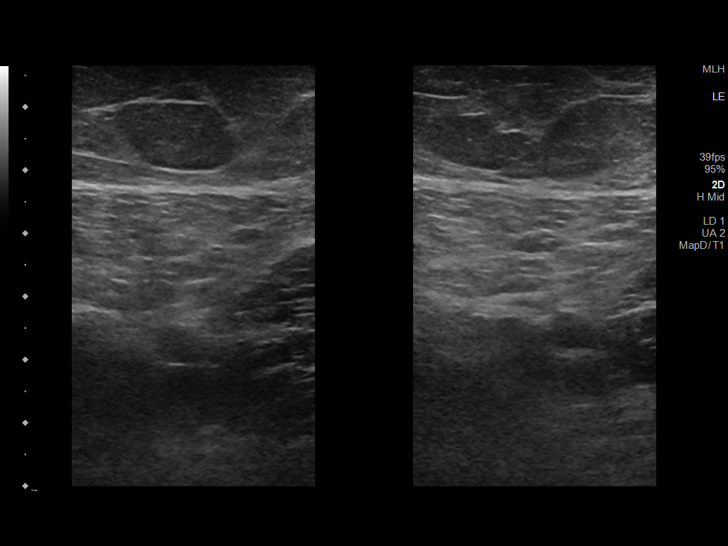
[im 19/30]
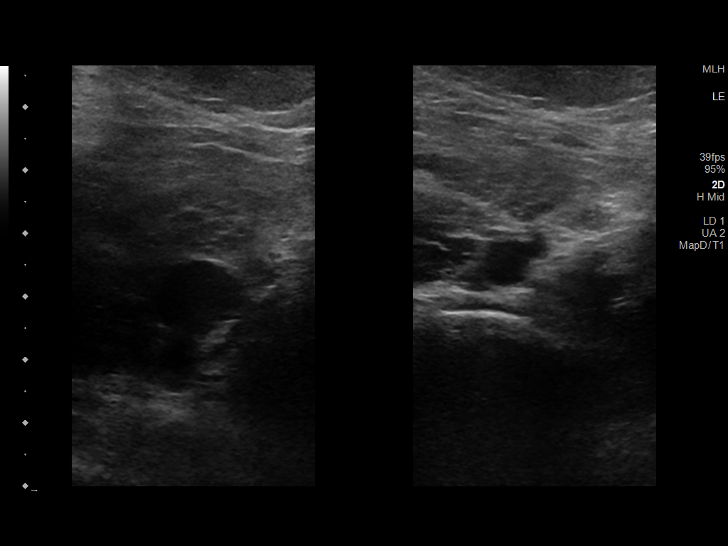
[im 22/30]
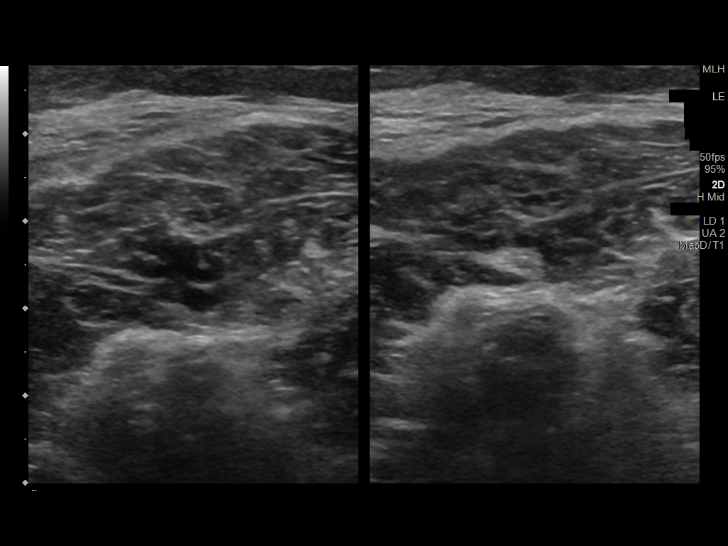
[im 24/30]
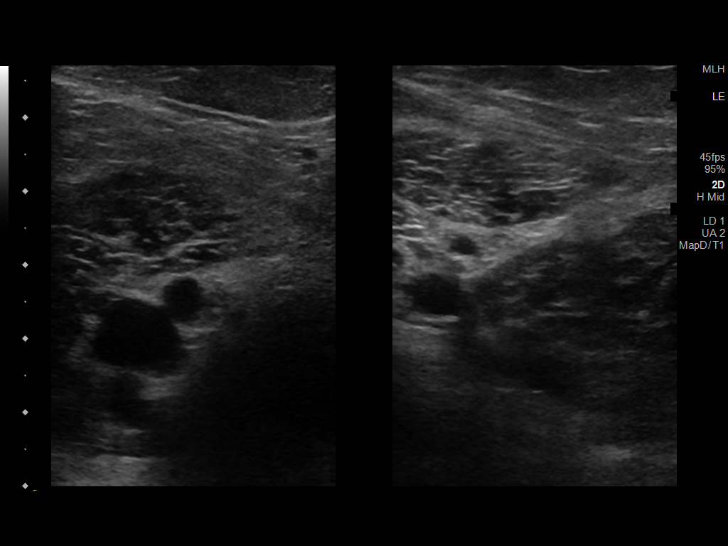
[im 27/30]
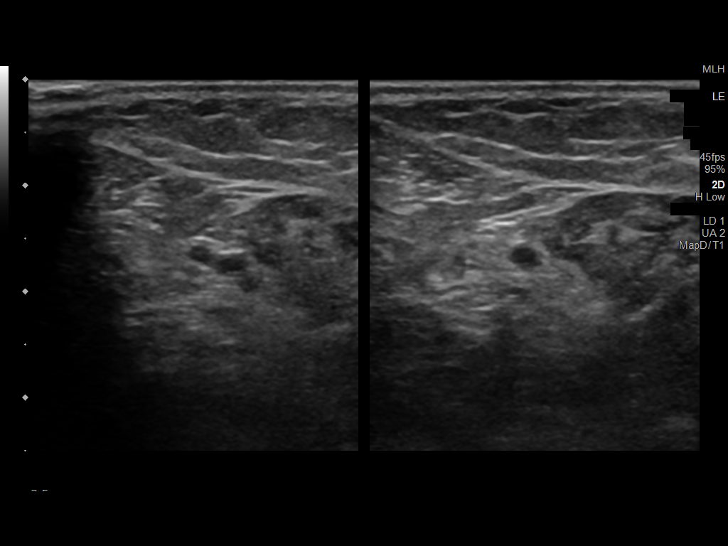
[im 30/30]
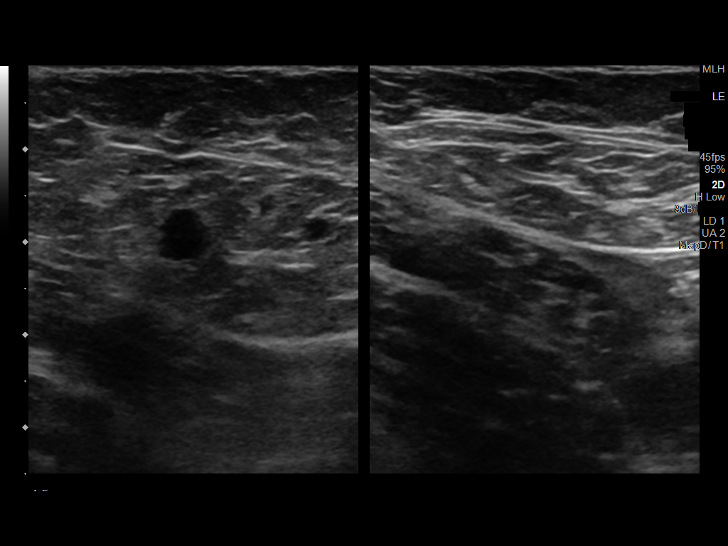

[13 of 24 positions shown; findings below may reference images not displayed]

FINDINGS: Contralateral Common Femoral Vein: Respiratory phasicity is normal
and symmetric with the symptomatic side. No evidence of thrombus.
Normal compressibility.

Common Femoral Vein: No evidence of thrombus. Normal
compressibility, respiratory phasicity and response to augmentation.

Saphenofemoral Junction: No evidence of thrombus. Normal
compressibility and flow on color Doppler imaging.

Profunda Femoral Vein: No evidence of thrombus. Normal
compressibility and flow on color Doppler imaging.

Femoral Vein: No evidence of thrombus. Normal compressibility,
respiratory phasicity and response to augmentation.

Popliteal Vein: No evidence of thrombus. Normal compressibility,
respiratory phasicity and response to augmentation.

Calf Veins: No evidence of thrombus. Normal compressibility and flow
on color Doppler imaging.

Superficial Great Saphenous Vein: No evidence of thrombus. Normal
compressibility.

Venous Reflux:  None.

Other Findings: No evidence of superficial thrombophlebitis or
abnormal fluid collection.
IMPRESSION: No evidence of left lower extremity deep venous thrombosis.

## 2023-06-06 ENCOUNTER — Other Ambulatory Visit: Payer: Self-pay

## 2023-06-06 ENCOUNTER — Emergency Department (HOSPITAL_BASED_OUTPATIENT_CLINIC_OR_DEPARTMENT_OTHER)
Admission: EM | Admit: 2023-06-06 | Discharge: 2023-06-06 | Disposition: A | Discharge status: Home / Self Care | Payer: BLUE CROSS/BLUE SHIELD | Attending: Emergency Medicine | Admitting: Emergency Medicine

## 2023-06-06 ENCOUNTER — Encounter (HOSPITAL_BASED_OUTPATIENT_CLINIC_OR_DEPARTMENT_OTHER): Payer: Self-pay | Admitting: Emergency Medicine

## 2023-06-06 ENCOUNTER — Emergency Department (HOSPITAL_BASED_OUTPATIENT_CLINIC_OR_DEPARTMENT_OTHER): Payer: BLUE CROSS/BLUE SHIELD

## 2023-06-06 DIAGNOSIS — M542 Cervicalgia: Secondary | ICD-10-CM | POA: Diagnosis not present

## 2023-06-06 DIAGNOSIS — R059 Cough, unspecified: Secondary | ICD-10-CM | POA: Insufficient documentation

## 2023-06-06 DIAGNOSIS — R0789 Other chest pain: Secondary | ICD-10-CM | POA: Insufficient documentation

## 2023-06-06 DIAGNOSIS — R220 Localized swelling, mass and lump, head: Secondary | ICD-10-CM | POA: Diagnosis present

## 2023-06-06 DIAGNOSIS — B338 Other specified viral diseases: Secondary | ICD-10-CM

## 2023-06-06 DIAGNOSIS — E876 Hypokalemia: Secondary | ICD-10-CM | POA: Diagnosis not present

## 2023-06-06 DIAGNOSIS — B974 Respiratory syncytial virus as the cause of diseases classified elsewhere: Secondary | ICD-10-CM | POA: Diagnosis not present

## 2023-06-06 DIAGNOSIS — Z20822 Contact with and (suspected) exposure to covid-19: Secondary | ICD-10-CM | POA: Insufficient documentation

## 2023-06-06 LAB — BASIC METABOLIC PANEL
Anion gap: 7 (ref 5–15)
BUN: 8 mg/dL (ref 6–20)
CO2: 26 mmol/L (ref 22–32)
Calcium: 8.6 mg/dL — ABNORMAL LOW (ref 8.9–10.3)
Chloride: 104 mmol/L (ref 98–111)
Creatinine, Ser: 0.84 mg/dL (ref 0.44–1.00)
GFR, Estimated: >60 mL/min (ref >60)
Glucose, Bld: 99 mg/dL (ref 70–99)
Potassium: 3.3 mmol/L — ABNORMAL LOW (ref 3.5–5.1)
Sodium: 137 mmol/L (ref 135–145)

## 2023-06-06 LAB — RESP PANEL BY RT-PCR (RSV, FLU A&B, COVID)  RVPGX2
Influenza A by PCR: NEGATIVE
Influenza B by PCR: NEGATIVE
Resp Syncytial Virus by PCR: POSITIVE — AB
SARS Coronavirus 2 by RT PCR: NEGATIVE

## 2023-06-06 LAB — CBC
HCT: 42.1 % (ref 36.0–46.0)
Hemoglobin: 13.9 g/dL (ref 12.0–15.0)
MCH: 29.3 pg (ref 26.0–34.0)
MCHC: 33 g/dL (ref 30.0–36.0)
MCV: 88.8 fL (ref 80.0–100.0)
Platelets: 261 10*3/uL (ref 150–400)
RBC: 4.74 MIL/uL (ref 3.87–5.11)
RDW: 13.7 % (ref 11.5–15.5)
WBC: 5.8 10*3/uL (ref 4.0–10.5)
nRBC: 0 % (ref 0.0–0.2)

## 2023-06-06 LAB — TROPONIN I (HIGH SENSITIVITY): Troponin I (High Sensitivity): 4 ng/L (ref <18)

## 2023-06-06 NOTE — ED Triage Notes (Signed)
Pt c/o facial swelling since yesterday and facial pain started today; pain is in bil cheeks and tracks to temporal area; no significant edema noted at this time

## 2023-06-06 NOTE — Discharge Instructions (Addendum)
Your evaluated emergency room for facial swelling, neck pain and chest pain.  Your lab work, EKG and chest x-ray did not show any acute abnormality.  Your respiratory panel was negative for RSV.  This is a viral illness that does not require antibiotics.  Your symptoms should improve over the next several days.  I would recommend you follow-up with your primary care doctor within the next week to ensure they do.  If you experience any new or worsening symptoms including extreme worsening of chest pain, shortness of breath, persistent productive cough and fevers please return to the emergency room.

## 2023-06-06 NOTE — ED Provider Notes (Signed)
[facility] DE[facility] AT [facility] Provider Note   CSN: [id number] Arrival date & time: 06/06/23  1808     History  Chief Complaint  Patient presents with   Facial Swelling    Kristen Wiggins is a 53 y.o. female presents with multiple complaints including facial swelling, neck pain and chest pain.  Patient states she started noticing facial swelling yesterday with pain that radiates down to her left upper trap.  Denies any injury or trauma.  She reports intermittent cough but denies any congestion or fevers.  No vomiting or diarrhea reported.  States approximately 2 weeks ago she was feeling ill and had chest pain as well went to urgent care where she was given Jerilynn Som and steroid course and her symptoms improved.  Her chest pain has returned within the past 2 days.  There seems to be no exertional component.  She is describing chest pain at rest during this exam.  HPI     Home Medications Prior to Admission medications   Medication Sig Start Date End Date Taking? Authorizing Provider  amLODipine (NORVASC) 5 MG tablet Take by mouth. 02/16/18   [provider]  bisoprolol-hydrochlorothiazide (ZIAC) 5-6.25 MG tablet Take 1 tablet by mouth daily.    [provider]  fluticasone (FLONASE) 50 MCG/ACT nasal spray Place into the nose. 07/16/15   [provider]  lisinopril (PRINIVIL,ZESTRIL) 5 MG tablet TAKE 1 TABLET BY MOUTH EVERY DAY 10/23/17   [provider]  meloxicam (MOBIC) 15 MG tablet TAKE 1 TABLET BY MOUTH EVERY DAY FOR RIGHT SCIATIC PAIN**DO NOT TAKE WITH ALEVE, MOTRIN,IBUPROFEN 08/13/15   [provider]  montelukast (SINGULAIR) 10 MG tablet Take by mouth. 07/16/15   [provider]  naproxen (NAPROSYN) 500 MG tablet Take 1 tablet (500 mg total) by mouth 2 (two) times daily. 04/05/18   Petrucelli, Samantha R, PA-C  ondansetron (ZOFRAN ODT) 4 MG disintegrating tablet Take 1 tablet (4 mg total) by mouth  every 8 (eight) hours as needed for nausea or vomiting. 05/30/16   Gwyneth Sprout, MD  pantoprazole (PROTONIX) 40 MG tablet TAKE 1 TABLET BY MOUTH EVERY DAY 01/01/18   [provider]  Peppermint Oil (IBGARD PO) Take by mouth.    [provider]  polyethylene glycol (MIRALAX) packet Take 17 g by mouth daily. 03/05/18   Horton, Mayer Masker, MD  topiramate (TOPAMAX) 100 MG tablet TAKE 2 TABLETS (200 MG TOTAL) BY MOUTH NIGHTLY.CKO 11/02/17   [provider]      Allergies    Celecoxib, Nitrofurantoin, and Penicillins    Review of Systems   Review of Systems  HENT:  Positive for facial swelling.   Cardiovascular:  Positive for chest pain.    Physical Exam Updated Vital Signs BP 132/89   Pulse 62   Temp 98.1 F (36.7 C) (Oral)   Resp 14   Ht 5\' 8"  (1.727 m)   Wt 110.7 kg   SpO2 97%   BMI 37.10 kg/m  Physical Exam Vitals and nursing note reviewed.  Constitutional:      General: She is not in acute distress.    Appearance: She is well-developed.  HENT:     Head: Normocephalic and atraumatic.     Comments: No significant facial swelling appreciated, no angioedema, there is no dental abscess or gingivitis noted Eyes:     Conjunctiva/sclera: Conjunctivae normal.  Neck:     Comments: She describes pain to left upper trap without tenderness, there  is no cervical lymphadenopathy Cardiovascular:     Rate and Rhythm: Normal rate and regular rhythm.     Heart sounds: No murmur heard. Pulmonary:     Effort: Pulmonary effort is normal. No respiratory distress.     Breath sounds: Normal breath sounds.  Abdominal:     Palpations: Abdomen is soft.     Tenderness: There is no abdominal tenderness.  Musculoskeletal:        General: No swelling.     Cervical back: Normal range of motion and neck supple.  Skin:    General: Skin is warm and dry.     Capillary Refill: Capillary refill takes less than 2 seconds.  Neurological:     Mental Status: She is alert.   Psychiatric:        Mood and Affect: Mood normal.     ED Results / Procedures / Treatments   Labs (all labs ordered are listed, but only abnormal results are displayed) Labs Reviewed  RESP PANEL BY RT-PCR (RSV, FLU A&B, COVID)  RVPGX2 - Abnormal; Notable for the following components:      Result Value   Resp Syncytial Virus by PCR POSITIVE (*)    All other components within normal limits  BASIC METABOLIC PANEL - Abnormal; Notable for the following components:   Potassium 3.3 (*)    Calcium 8.6 (*)    All other components within normal limits  CBC  TROPONIN I (HIGH SENSITIVITY)  TROPONIN I (HIGH SENSITIVITY)    EKG None  Radiology DG Chest Port 1 View Result Date: 06/06/2023 CLINICAL DATA:  Central chest pain for 2 weeks on and off EXAM: PORTABLE CHEST 1 VIEW COMPARISON:  05/06/2023 FINDINGS: The heart size and mediastinal contours are within normal limits. Both lungs are clear. The visualized skeletal structures are unremarkable. IMPRESSION: No active disease. Electronically Signed   By: Minerva Fester M.D.   On: 06/06/2023 21:02    Procedures Procedures    Medications Ordered in ED Medications - No data to display  ED Course/ Medical Decision Making/ A&P                                 Medical Decision Making  This patient presents to the ED with chief complaint(s) of facial swelling, neck pain and chest pain .  The complaint involves an extensive differential diagnosis and also carries with it a high risk of complications and morbidity.   pertinent past medical history as listed in HPI  The differential diagnosis includes  ACS, PE, aortic dissection, pneumonia, pneumothorax, myocarditis, pericarditis, musculoskeletal, GERD, esophageal dissection The initial plan is to  Will start with basic labs, EKG, chest x-ray Additional history obtained:  Records reviewed previous admission documents and Care Everywhere/External Records  Initial Assessment:   Patient  presents hypertensive to 150/97 with complaints of facial swelling, neck pain and chest pain.  She has had the chest pain intermittently for the past 2 weeks.  There is no exertional component.  She has no history of blood clots or cardiac history.  She does have a history of hypertension.  There is no reproducible chest tenderness on exam.  Overall low suspicion for ACS, PE, aortic dissection.  Her lung sounds are clear she is afebrile.  Low suspicion for pneumonia.  Do not appreciate any significant facial swelling on exam, she has no tenderness.   Independent ECG interpretation:  Sinus rhythm without ischemic changes  Independent labs interpretation:  The following labs were independently interpreted:  BMP with mild hypokalemia, respiratory panel positive for RSV, CBC unremarkable, troponin without elevation  Independent visualization and interpretation of imaging: I independently visualized the following imaging with scope of interpretation limited to determining acute life threatening conditions related to emergency care: Chest x-ray, which revealed no cardiopulmonary disease  Treatment and Reassessment: No medications administered during visit  Consultations obtained:   None  Disposition:   Patient will be discharged home.  Encouraged to follow-up primary care doctor within the next 3 to 5 days. The patient has been appropriately medically screened and/or stabilized in the ED. I have low suspicion for any other emergent medical condition which would require further screening, evaluation or treatment in the ED or require inpatient management. At time of discharge the patient is hemodynamically stable and in no acute distress. I have discussed work-up results and diagnosis with patient and answered all questions. Patient is agreeable with discharge plan. We discussed strict return precautions for returning to the emergency department and they verbalized understanding.     Social  Determinants of Health:   none  This note was dictated with voice recognition software.  Despite best efforts at proofreading, errors may have occurred which can change the documentation meaning.          Final Clinical Impression(s) / ED Diagnoses Final diagnoses:  Atypical chest pain  Facial swelling  Neck pain  RSV (respiratory syncytial virus infection)    Rx / DC Orders ED Discharge Orders     None         Fabienne Bruns 06/06/23 2243    Derwood Kaplan, MD 06/09/23 1724

## 2023-07-30 ENCOUNTER — Encounter (HOSPITAL_BASED_OUTPATIENT_CLINIC_OR_DEPARTMENT_OTHER): Payer: Self-pay

## 2023-07-30 ENCOUNTER — Other Ambulatory Visit: Payer: Self-pay

## 2023-07-30 ENCOUNTER — Emergency Department (HOSPITAL_BASED_OUTPATIENT_CLINIC_OR_DEPARTMENT_OTHER)

## 2023-07-30 ENCOUNTER — Emergency Department (HOSPITAL_BASED_OUTPATIENT_CLINIC_OR_DEPARTMENT_OTHER)
Admission: EM | Admit: 2023-07-30 | Discharge: 2023-07-30 | Disposition: A | Discharge status: Home / Self Care | Attending: Emergency Medicine | Admitting: Emergency Medicine

## 2023-07-30 DIAGNOSIS — N3000 Acute cystitis without hematuria: Secondary | ICD-10-CM | POA: Diagnosis not present

## 2023-07-30 DIAGNOSIS — Z79899 Other long term (current) drug therapy: Secondary | ICD-10-CM | POA: Insufficient documentation

## 2023-07-30 DIAGNOSIS — R1013 Epigastric pain: Secondary | ICD-10-CM | POA: Diagnosis present

## 2023-07-30 DIAGNOSIS — I10 Essential (primary) hypertension: Secondary | ICD-10-CM | POA: Insufficient documentation

## 2023-07-30 LAB — CBC
HCT: 42.7 % (ref 36.0–46.0)
Hemoglobin: 14.3 g/dL (ref 12.0–15.0)
MCH: 30.2 pg (ref 26.0–34.0)
MCHC: 33.5 g/dL (ref 30.0–36.0)
MCV: 90.1 fL (ref 80.0–100.0)
Platelets: 280 10*3/uL (ref 150–400)
RBC: 4.74 MIL/uL (ref 3.87–5.11)
RDW: 14 % (ref 11.5–15.5)
WBC: 5 10*3/uL (ref 4.0–10.5)
nRBC: 0 % (ref 0.0–0.2)

## 2023-07-30 LAB — COMPREHENSIVE METABOLIC PANEL
ALT: 23 U/L (ref 0–44)
AST: 17 U/L (ref 15–41)
Albumin: 3.7 g/dL (ref 3.5–5.0)
Alkaline Phosphatase: 66 U/L (ref 38–126)
Anion gap: 9 (ref 5–15)
BUN: 13 mg/dL (ref 6–20)
CO2: 24 mmol/L (ref 22–32)
Calcium: 8.5 mg/dL — ABNORMAL LOW (ref 8.9–10.3)
Chloride: 104 mmol/L (ref 98–111)
Creatinine, Ser: 0.99 mg/dL (ref 0.44–1.00)
GFR, Estimated: >60 mL/min (ref >60)
Glucose, Bld: 108 mg/dL — ABNORMAL HIGH (ref 70–99)
Potassium: 3.6 mmol/L (ref 3.5–5.1)
Sodium: 137 mmol/L (ref 135–145)
Total Bilirubin: 0.7 mg/dL (ref 0.0–1.2)
Total Protein: 7.3 g/dL (ref 6.5–8.1)

## 2023-07-30 LAB — PREGNANCY, URINE: Preg Test, Ur: NEGATIVE

## 2023-07-30 LAB — URINALYSIS, MICROSCOPIC (REFLEX)

## 2023-07-30 LAB — URINALYSIS, ROUTINE W REFLEX MICROSCOPIC
Bilirubin Urine: NEGATIVE
Glucose, UA: NEGATIVE mg/dL
Ketones, ur: NEGATIVE mg/dL
Nitrite: NEGATIVE
Protein, ur: NEGATIVE mg/dL
Specific Gravity, Urine: 1.02 (ref 1.005–1.030)
pH: 5.5 (ref 5.0–8.0)

## 2023-07-30 LAB — LIPASE, BLOOD: Lipase: 31 U/L (ref 11–51)

## 2023-07-30 LAB — TROPONIN I (HIGH SENSITIVITY): Troponin I (High Sensitivity): 3 ng/L (ref <18)

## 2023-07-30 MED ORDER — SODIUM CHLORIDE 0.9 % IV SOLN
1.0000 g | Freq: Once | INTRAVENOUS | Status: AC
  Administered 2023-07-30: 1 g via INTRAVENOUS
  Filled 2023-07-30: qty 10

## 2023-07-30 MED ORDER — SODIUM CHLORIDE 0.9 % IV SOLN: INTRAVENOUS | Status: DC | PRN

## 2023-07-30 MED ORDER — CEPHALEXIN 500 MG PO CAPS: 500.0000 mg | ORAL_CAPSULE | Freq: Two times a day (BID) | ORAL | 0 refills | Status: AC

## 2023-07-30 NOTE — Discharge Instructions (Addendum)
 You have a urinary tract infection, this is likely what is causing the cramping, and your abdomen.  Because of your epigastric pain, is unclear at this time it may be secondary to your IBS.  I recommend following up with your primary care doctor, and returning if you feel like your symptoms are worsening

## 2023-07-30 NOTE — ED Provider Notes (Signed)
 [facility] Provider Note   CSN: [id number] Arrival date & time: 07/30/23  1240     History  Chief Complaint  Patient presents with   Abdominal Pain    Kristen Wiggins is a 53 y.o. female, history of hypertension, IBS, who presents to the ED secondary to epigastric pain, and lower abdominal cramping, this been going on for the last couple days.  She states been going on for the last 4 days, and denies any association with food, movement.  States that she is kind of random, and all the time, and is a 5 out of 10.  She states she takes Bentyl at home, but has not been taking it, as it constipates her.  Reports normal bowel movements except for a little bit of straining, which she states is pretty normal for her.  Endorses some nausea, but no vomiting.  Denies any fevers or chills.  No urinary complaints, or vaginal complaints.    Home Medications Prior to Admission medications   Medication Sig Start Date End Date Taking? Authorizing Provider  cephALEXin (KEFLEX) 500 MG capsule Take 1 capsule (500 mg total) by mouth 2 (two) times daily. 07/30/23  Yes Jodi Criscuolo L, PA  amLODipine (NORVASC) 5 MG tablet Take by mouth. 02/16/18   [provider]  bisoprolol-hydrochlorothiazide (ZIAC) 5-6.25 MG tablet Take 1 tablet by mouth daily.    [provider]  fluticasone (FLONASE) 50 MCG/ACT nasal spray Place into the nose. 07/16/15   [provider]  lisinopril (PRINIVIL,ZESTRIL) 5 MG tablet TAKE 1 TABLET BY MOUTH EVERY DAY 10/23/17   [provider]  meloxicam (MOBIC) 15 MG tablet TAKE 1 TABLET BY MOUTH EVERY DAY FOR RIGHT SCIATIC PAIN**DO NOT TAKE WITH ALEVE, MOTRIN,IBUPROFEN 08/13/15   [provider]  montelukast (SINGULAIR) 10 MG tablet Take by mouth. 07/16/15   [provider]  naproxen (NAPROSYN) 500 MG tablet Take 1 tablet (500 mg total) by mouth 2 (two) times daily. 04/05/18   Petrucelli, Samantha R,  PA-C  ondansetron (ZOFRAN ODT) 4 MG disintegrating tablet Take 1 tablet (4 mg total) by mouth every 8 (eight) hours as needed for nausea or vomiting. 05/30/16   Gwyneth Sprout, MD  pantoprazole (PROTONIX) 40 MG tablet TAKE 1 TABLET BY MOUTH EVERY DAY 01/01/18   [provider]  Peppermint Oil (IBGARD PO) Take by mouth.    [provider]  polyethylene glycol (MIRALAX) packet Take 17 g by mouth daily. 03/05/18   Horton, Mayer Masker, MD  topiramate (TOPAMAX) 100 MG tablet TAKE 2 TABLETS (200 MG TOTAL) BY MOUTH NIGHTLY.CKO 11/02/17   [provider]      Allergies    Celecoxib, Nitrofurantoin, and Penicillins    Review of Systems   Review of Systems  Gastrointestinal:  Positive for abdominal pain and nausea. Negative for vomiting.    Physical Exam Updated Vital Signs BP 124/84 (BP Location: Right Arm)   Pulse 72   Temp 99 F (37.2 C) (Oral)   Resp 18   Ht 5\' 8"  (1.727 m)   Wt 107.5 kg   SpO2 100%   BMI 36.04 kg/m  Physical Exam Vitals and nursing note reviewed.  Constitutional:      General: She is not in acute distress.    Appearance: She is well-developed.  HENT:     Head: Normocephalic and atraumatic.  Eyes:     Conjunctiva/sclera: Conjunctivae normal.  Cardiovascular:     Rate and Rhythm:  Normal rate and regular rhythm.     Heart sounds: No murmur heard. Pulmonary:     Effort: Pulmonary effort is normal. No respiratory distress.     Breath sounds: Normal breath sounds.  Abdominal:     Palpations: Abdomen is soft.     Tenderness: There is abdominal tenderness in the epigastric area and suprapubic area.  Musculoskeletal:        General: No swelling.     Cervical back: Neck supple.  Skin:    General: Skin is warm and dry.     Capillary Refill: Capillary refill takes less than 2 seconds.  Neurological:     Mental Status: She is alert.  Psychiatric:        Mood and Affect: Mood normal.     ED Results / Procedures / Treatments    Labs (all labs ordered are listed, but only abnormal results are displayed) Labs Reviewed  COMPREHENSIVE METABOLIC PANEL - Abnormal; Notable for the following components:      Result Value   Glucose, Bld 108 (*)    Calcium 8.5 (*)    All other components within normal limits  URINALYSIS, ROUTINE W REFLEX MICROSCOPIC - Abnormal; Notable for the following components:   Hgb urine dipstick TRACE (*)    Leukocytes,Ua Takita Riecke (*)    All other components within normal limits  URINALYSIS, MICROSCOPIC (REFLEX) - Abnormal; Notable for the following components:   Bacteria, UA MANY (*)    All other components within normal limits  LIPASE, BLOOD  CBC  PREGNANCY, URINE  TROPONIN I (HIGH SENSITIVITY)  TROPONIN I (HIGH SENSITIVITY)    EKG EKG Interpretation Date/Time:  Sunday July 30 2023 12:59:52 EDT Ventricular Rate:  69 PR Interval:  160 QRS Duration:  81 QT Interval:  390 QTC Calculation: 418 R Axis:   -14  Text Interpretation: Sinus rhythm Anterior infarct, old Confirmed by Virgina Norfolk [phone]) on 07/30/2023 1:06:41 PM  Radiology DG Chest 2 View Result Date: 07/30/2023 CLINICAL DATA:  Epigastric pain EXAM: CHEST - 2 VIEW COMPARISON:  Chest radiograph dated 06/06/2023 FINDINGS: Normal lung volumes. No focal consolidations. No pleural effusion or pneumothorax. The heart size and mediastinal contours are within normal limits. No acute osseous abnormality. IMPRESSION: No active cardiopulmonary disease. Electronically Signed   By: Agustin Cree M.D.   On: 07/30/2023 15:38   DG Abdomen 1 View Result Date: 07/30/2023 CLINICAL DATA:  Epigastric pain EXAM: ABDOMEN - 1 VIEW COMPARISON:  None Available. FINDINGS: The bowel gas pattern is normal. No radio-opaque calculi or other significant radiographic abnormality are seen. Phleboliths in the pelvis. IMPRESSION: Negative. Electronically Signed   By: Jasmine Pang M.D.   On: 07/30/2023 15:33    Procedures Procedures    Medications Ordered in  ED Medications  cefTRIAXone (ROCEPHIN) 1 g in sodium chloride 0.9 % 100 mL IVPB (1 g Intravenous New Bag/Given 07/30/23 1610)  0.9 %  sodium chloride infusion ( Intravenous New Bag/Given 07/30/23 1607)    ED Course/ Medical Decision Making/ A&P                                 Medical Decision Making Patient is a 53 year old female, here for epigastric pain, has been going on for the last few days, as well as bilateral lower quadrant abdominal pain.  She has epigastric pain, on palpation, minimal tenderness to palpation of bilateral lower quadrants.  He is overall well-appearing, given the  epigastric pain, will obtain troponin, lipase, chest x-ray, also will obtain KUB, blood work for further evaluation and urinalysis secondary to bilateral lower quadrant abdominal pain.  She has no vaginal symptoms however, thus think unlikely secondary to vaginal source.  Offered heating pad for pain control  Amount and/or Complexity of Data Reviewed Labs: ordered.    Details: Many bacteria in urine, blood work is overall reassuring Radiology: ordered.    Details: Chest x-ray clear, KUB unremarkable Discussion of management or test interpretation with external provider(s): Blood work is overall reassuring, KUB and chest x-ray unremarkable.  Negative troponin.  Many bacteria in urine, this is likely causing the bilateral lower quadrant abdominal pain, secondary to bladder spasms.  Will start on Keflex, given 1 dose of ceftriaxone, in the ED, and discharged home with strict return precautions.  Cause of epigastric pain unclear at this time, may be secondary to IBS?  Versus GERD.  She states she has been compliant with her GERD medications.  Will have her follow-up with primary care doctor, return if symptoms worsen  Risk Prescription drug management.   Final Clinical Impression(s) / ED Diagnoses Final diagnoses:  Acute cystitis without hematuria  Epigastric abdominal pain    Rx / DC Orders ED Discharge  Orders          Ordered    cephALEXin (KEFLEX) 500 MG capsule  2 times daily        07/30/23 1551              Miko Sirico Elbert Ewings, PA 07/30/23 1614    Virgina Norfolk, DO 08/02/23 1630

## 2023-07-30 NOTE — ED Triage Notes (Addendum)
 C/o cramping type pain intermittently since wednesday, tenderness to epigastric region. Nausea. Sent by UC.   Hasn't eaten much but said pain worsened while trying to eat a sub yesterday.

## 2023-07-30 NOTE — ED Notes (Signed)
 Patient transported to CT

## 2023-07-30 NOTE — ED Notes (Signed)
 ED Provider at bedside.
